# Patient Record
Sex: Male | Born: 2000
Health system: Southern US, Community
[De-identification: ages and names within clinical notes are randomized; demographics above are authoritative.]

## PROBLEM LIST (undated history)

## (undated) DIAGNOSIS — F909 Attention-deficit hyperactivity disorder, unspecified type: Secondary | ICD-10-CM

## (undated) DIAGNOSIS — R4589 Other symptoms and signs involving emotional state: Secondary | ICD-10-CM

## (undated) DIAGNOSIS — J309 Allergic rhinitis, unspecified: Secondary | ICD-10-CM

## (undated) DIAGNOSIS — F418 Other specified anxiety disorders: Secondary | ICD-10-CM

## (undated) DIAGNOSIS — N200 Calculus of kidney: Secondary | ICD-10-CM

## (undated) DIAGNOSIS — J45909 Unspecified asthma, uncomplicated: Principal | ICD-10-CM

## (undated) DIAGNOSIS — W3400XA Accidental discharge from unspecified firearms or gun, initial encounter: Secondary | ICD-10-CM

## (undated) HISTORY — DX: Allergic rhinitis, unspecified: J30.9

## (undated) HISTORY — DX: Unspecified asthma, uncomplicated: J45.909

## (undated) HISTORY — DX: Attention-deficit hyperactivity disorder, unspecified type: F90.9

## (undated) HISTORY — DX: Other symptoms and signs involving emotional state: R45.89

## (undated) HISTORY — PX: CIRCUMCISION: SUR203

## (undated) HISTORY — DX: Other specified anxiety disorders: F41.8

## (undated) HISTORY — PX: CLOSED REDUCTION MANDIBULAR FRACTURE W/ ARCH BARS: SHX1360

---
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
## 2022-01-01 ENCOUNTER — Other Ambulatory Visit: Payer: Self-pay | Admitting: Family Medicine

## 2022-01-01 DIAGNOSIS — E559 Vitamin D deficiency, unspecified: Secondary | ICD-10-CM

## 2022-01-01 MED ORDER — VITAMIN D (ERGOCALCIFEROL) 1.25 MG (50000 UNIT) PO CAPS: 50000.0000 [IU] | ORAL_CAPSULE | ORAL | 1 refills | Status: DC

## 2022-01-01 NOTE — Progress Notes (Signed)
Please inform the patient his Vit D is low, and his cholesterol is elevated. I recommend low carbs and fat diet. I've sent  a prescription for Vit D supplement to his pharmacy.

## 2022-01-13 ENCOUNTER — Encounter (HOSPITAL_COMMUNITY): Payer: Self-pay

## 2022-01-13 ENCOUNTER — Emergency Department (HOSPITAL_COMMUNITY)
Admission: EM | Admit: 2022-01-13 | Discharge: 2022-01-13 | Disposition: A | Discharge status: Home / Self Care | Payer: Medicaid Other | Attending: Emergency Medicine | Admitting: Emergency Medicine

## 2022-01-13 ENCOUNTER — Other Ambulatory Visit: Payer: Self-pay

## 2022-01-13 DIAGNOSIS — S025XXA Fracture of tooth (traumatic), initial encounter for closed fracture: Secondary | ICD-10-CM

## 2022-01-13 DIAGNOSIS — X58XXXA Exposure to other specified factors, initial encounter: Secondary | ICD-10-CM | POA: Insufficient documentation

## 2022-01-13 DIAGNOSIS — K0889 Other specified disorders of teeth and supporting structures: Secondary | ICD-10-CM | POA: Diagnosis not present

## 2022-01-13 MED ORDER — HYDROCODONE-ACETAMINOPHEN 5-325 MG PO TABS: 1.0000 | ORAL_TABLET | Freq: Four times a day (QID) | ORAL | 0 refills | Status: AC | PRN
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
## 2022-01-13 NOTE — ED Triage Notes (Signed)
Left lower dental pain has been to dentist has antibiotics and ibuprofen 800 mg

## 2022-01-20 ENCOUNTER — Other Ambulatory Visit: Payer: Self-pay

## 2022-01-20 ENCOUNTER — Encounter (HOSPITAL_COMMUNITY): Payer: Self-pay

## 2022-01-20 ENCOUNTER — Emergency Department (HOSPITAL_COMMUNITY)
Admission: EM | Admit: 2022-01-20 | Discharge: 2022-01-20 | Disposition: A | Discharge status: Home / Self Care | Payer: Medicaid Other | Attending: Emergency Medicine | Admitting: Emergency Medicine

## 2022-01-20 DIAGNOSIS — K0889 Other specified disorders of teeth and supporting structures: Secondary | ICD-10-CM | POA: Diagnosis not present

## 2022-01-20 DIAGNOSIS — J45909 Unspecified asthma, uncomplicated: Secondary | ICD-10-CM | POA: Diagnosis not present

## 2022-01-20 MED ORDER — HYDROCODONE-ACETAMINOPHEN 5-325 MG PO TABS: 1.0000 | ORAL_TABLET | Freq: Four times a day (QID) | ORAL | 0 refills | Status: DC | PRN

## 2022-01-20 MED ORDER — HYDROCODONE-ACETAMINOPHEN 5-325 MG PO TABS
1.0000 | ORAL_TABLET | Freq: Once | ORAL | Status: AC
  Administered 2022-01-20: 1 via ORAL
  Filled 2022-01-20: qty 1

## 2022-01-20 NOTE — ED Provider Notes (Signed)
[facility] EMERGENCY DEPARTMENT Provider Note   CSN: [id number] Arrival date & time: 01/20/22  1817     History  Chief Complaint  Patient presents with   Dental Pain    Carlos Horton is a 21 y.o. male who presents to the emergency department complaining of dental pain.  Patient has a known broken left lower molar.  He originally went to the dentist and early August and was placed on amoxicillin, then went to the ER on 8/8.  Recommended that he continue the amoxicillin and over-the-counter pain medicine.  He followed up with his doctor on 8/11.  He then presented to the ER on 8/26 for similar symptoms.  At that time he was going to be following up with a dentist.  Dental paste was placed over the tooth and he was discharged with pain medicine.  He went to the dentist and reported that they told him he needs to have a root canal and place a crown on the tooth.  He states that at that visit they evaluated him and then scheduled his root canal for October.  He presents today with persistent pain, requesting assistance with pain medicine before he can follow-up with his doctor at the beginning of next week.  He finished the amoxicillin about 3 days ago.  He denies any difficulty swallowing, difficulty breathing, fevers or chills.   Dental Pain Associated symptoms: no fever        Home Medications Prior to Admission medications   Medication Sig Start Date End Date Taking? Authorizing Provider  HYDROcodone-acetaminophen (NORCO/VICODIN) 5-325 MG tablet Take 1 tablet by mouth every 6 (six) hours as needed for severe pain. 01/20/22  Yes Sharaya Boruff T, PA-C  albuterol (VENTOLIN HFA) 108 (90 Base) MCG/ACT inhaler Inhale 2 puffs into the lungs every 4 (four) hours as needed for wheezing. 02/08/20   Bobbie Stack, MD  amoxicillin (AMOXIL) 500 MG capsule Take 500 mg by mouth 3 (three) times daily. 12/20/21   [provider]  busPIRone (BUSPAR) 5 MG tablet Take 1 tablet (5 mg total) by mouth 2  (two) times daily. 12/29/21   Gilmore Laroche, FNP  DULoxetine (CYMBALTA) 30 MG capsule Take 1 capsule (30 mg total) by mouth daily. 07/08/21   Massengill, Harrold Donath, MD  gabapentin (NEURONTIN) 100 MG capsule Take 1 capsule (100 mg total) by mouth 3 (three) times daily. 07/07/21   Massengill, Harrold Donath, MD  IBU 600 MG tablet Take 600 mg by mouth every 6 (six) hours as needed. 12/20/21   [provider]  traZODone (DESYREL) 50 MG tablet Take 1 tablet (50 mg total) by mouth at bedtime as needed for sleep. 07/07/21   Massengill, Harrold Donath, MD  Vitamin D, Ergocalciferol, (DRISDOL) 1.25 MG (50000 UNIT) CAPS capsule Take 1 capsule (50,000 Units total) by mouth every 7 (seven) days. 01/01/22   Gilmore Laroche, FNP      Allergies    Patient has no known allergies.    Review of Systems   Review of Systems  Constitutional:  Negative for chills and fever.  HENT:  Positive for dental problem. Negative for sore throat, trouble swallowing and voice change.   All other systems reviewed and are negative.   Physical Exam Updated Vital Signs BP (!) 151/70 (BP Location: Right Arm)   Pulse 96   Temp (!) 97.5 F (36.4 C) (Oral)   Resp 17   Ht 5\' 5"  (1.651 m)   Wt 83 kg   SpO2 98%   BMI 30.45  kg/m  Physical Exam Vitals and nursing note reviewed.  Constitutional:      Appearance: Normal appearance.  HENT:     Head: Normocephalic and atraumatic.     Mouth/Throat:     Pharynx: Oropharynx is clear. Uvula midline.     Tonsils: No tonsillar abscesses.     Comments: Fracture of the left lower molar Eyes:     Conjunctiva/sclera: Conjunctivae normal.  Pulmonary:     Effort: Pulmonary effort is normal. No respiratory distress.  Skin:    General: Skin is warm and dry.  Neurological:     Mental Status: He is alert.  Psychiatric:        Mood and Affect: Mood normal.        Behavior: Behavior normal.     ED Results / Procedures / Treatments   Labs (all labs ordered are listed, but only abnormal results  are displayed) Labs Reviewed - No data to display  EKG None  Radiology No results found.  Procedures Procedures    Medications Ordered in ED Medications  HYDROcodone-acetaminophen (NORCO/VICODIN) 5-325 MG per tablet 1 tablet (has no administration in time range)    ED Course/ Medical Decision Making/ A&P                           Medical Decision Making Risk Prescription drug management.   Patient is a 21 year old male with history of ADHD, allergic rhinitis, and asthma who presents to the emergency department complaining of dental pain.  Has a follow-up appointment with the dentist in October for root canal and crown placement of a broken tooth.  He presents today requesting sealant/paste to assist with food from getting in the crack of his tooth, as well as some pain medicine.  Chart reviewed.  Patient seen on 8/8 and 8/26 for similar dental pain.  Most recent visit patient had dental paste placed over the tooth and was discharged with pain medicine.  Advised to continue course of amoxicillin. PDMP reviewed and consistent with patient report.   On exam patient has fracture of the left lower molar consistent with what was seen at most recent ER visit.  No pharyngeal erythema or evidence of abscess.  Dental cement placed and patient tolerated procedure well. Given one dose of pain medicine in ER and short prescription sent to pharmacy. Patient plans to follow up with his primary doctor early next week. Patient discharged in stable condition and all questions answered.         Final Clinical Impression(s) / ED Diagnoses Final diagnoses:  Pain, dental    Rx / DC Orders ED Discharge Orders          Ordered    HYDROcodone-acetaminophen (NORCO/VICODIN) 5-325 MG tablet  Every 6 hours PRN        01/20/22 2009           Portions of this report may have been transcribed using voice recognition software. Every effort was made to ensure accuracy; however, inadvertent  computerized transcription errors may be present.    Jeanella Flattery 01/20/22 2109    Eber Hong, MD 01/21/22 [phone]

## 2022-01-20 NOTE — Discharge Instructions (Addendum)
You were seen in the emergency department for dental pain.  We have placed some additional paste and have given you some more pain medication. I recommend following up with your doctor next week to discuss if further antibiotics are needed.

## 2022-01-20 NOTE — ED Triage Notes (Signed)
Pt to er, pt c/o dental pain, pt states that he has a dentist and they were going to put a crown on his tooth, states that his appointment got delayed and he is here because his tooth hurts, states that last time he was here for the same they put a sealant on his tooth that helped

## 2022-01-23 ENCOUNTER — Telehealth: Payer: Self-pay | Admitting: Licensed Clinical Social Worker

## 2022-01-23 NOTE — Patient Outreach (Addendum)
  Care Coordination [facility] Note Transition Care Management Unsuccessful Follow-up Telephone Call  Date of discharge and from where:  01/20/22 from [facility]  Attempts:  1st Attempt  Reason for unsuccessful TCM follow-up call:  Left voice message  Dickie La, BSW, MSW, [employer] Managed Medicaid LCSW [facility]  [facility] [email or web address] Phone: [phone]

## 2022-01-26 ENCOUNTER — Telehealth: Payer: Self-pay | Admitting: *Deleted

## 2022-01-26 NOTE — Patient Outreach (Signed)
Care Coordination  01/26/2022  Carlos Horton Feb 23, 2001 [id number]   Transition Care Management Unsuccessful Follow-up Telephone Call  Date of discharge and from where:  01/20/22 from [facility]  Attempts:  2nd Attempt  Reason for unsuccessful TCM follow-up call:  No answer/busy   [name] RN, BSN [place]  [facility] RN Care Coordinator

## 2022-01-29 ENCOUNTER — Telehealth: Payer: Self-pay | Admitting: *Deleted

## 2022-01-29 ENCOUNTER — Emergency Department (HOSPITAL_COMMUNITY)
Admission: EM | Admit: 2022-01-29 | Discharge: 2022-01-30 | Disposition: A | Discharge status: Home / Self Care | Payer: Medicaid Other | Attending: Emergency Medicine | Admitting: Emergency Medicine

## 2022-01-29 ENCOUNTER — Other Ambulatory Visit: Payer: Self-pay

## 2022-01-29 ENCOUNTER — Ambulatory Visit: Payer: Medicaid Other | Admitting: Family Medicine

## 2022-01-29 ENCOUNTER — Encounter: Payer: Self-pay | Admitting: Family Medicine

## 2022-01-29 ENCOUNTER — Encounter (HOSPITAL_COMMUNITY): Payer: Self-pay

## 2022-01-29 DIAGNOSIS — K0889 Other specified disorders of teeth and supporting structures: Secondary | ICD-10-CM

## 2022-01-29 DIAGNOSIS — J45909 Unspecified asthma, uncomplicated: Secondary | ICD-10-CM | POA: Insufficient documentation

## 2022-01-29 DIAGNOSIS — K029 Dental caries, unspecified: Secondary | ICD-10-CM | POA: Insufficient documentation

## 2022-01-29 DIAGNOSIS — M269 Dentofacial anomaly, unspecified: Secondary | ICD-10-CM | POA: Diagnosis not present

## 2022-01-29 MED ORDER — KETOROLAC TROMETHAMINE 30 MG/ML IJ SOLN
30.0000 mg | Freq: Once | INTRAMUSCULAR | Status: AC
Start: 2022-01-29 — End: 2022-01-29
  Administered 2022-01-29: 30 mg via INTRAMUSCULAR
  Filled 2022-01-29: qty 1

## 2022-01-29 NOTE — Patient Outreach (Signed)
Care Coordination  01/29/2022  Carlos Horton 2000/08/05 [id number]   Transition Care Management Unsuccessful Follow-up Telephone Call  Date of discharge and from where:  01/20/22 from [facility]  Attempts:  3rd Attempt  Reason for unsuccessful TCM follow-up call:  No answer/busy  [name] RN, BSN [name]  [facility] RN Care Coordinator

## 2022-01-29 NOTE — ED Triage Notes (Signed)
Pt c/o dental pain, was seen here on 9/2 for the same. States his left back tooth has cracked more and the pain has gotten worse. Was unable to make his appointment with dentist today because he didn't have a ride, and they are unable to get him an another appointment until October.

## 2022-01-30 MED ORDER — NAPROXEN 500 MG PO TABS: 500.0000 mg | ORAL_TABLET | Freq: Two times a day (BID) | ORAL | 0 refills | Status: DC

## 2022-01-30 MED ORDER — AMOXICILLIN 500 MG PO CAPS: 500.0000 mg | ORAL_CAPSULE | Freq: Three times a day (TID) | ORAL | 0 refills | Status: DC

## 2022-01-30 NOTE — Discharge Instructions (Signed)
It is very important that you follow-up with the dentist.  In the meantime, make sure to take medications as directed.
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
## 2022-01-31 ENCOUNTER — Telehealth: Payer: Self-pay | Admitting: *Deleted

## 2022-01-31 NOTE — Patient Outreach (Signed)
Care Coordination  01/31/2022  EARON RIVEST 05-29-2000 [id number]   Transition Care Management Unsuccessful Follow-up Telephone Call  Date of discharge and from where:  01/30/22 from [facility]  Attempts:  1st Attempt  Reason for unsuccessful TCM follow-up call:  Missing or invalid number   [name] RN, BSN [place]  [facility] Care Coordinator

## 2022-02-01 ENCOUNTER — Telehealth: Payer: Self-pay | Admitting: *Deleted

## 2022-02-01 NOTE — Patient Outreach (Signed)
Care Coordination  02/01/2022  Carlos Horton 10/07/2000 [id number]   Transition Care Management Unsuccessful Follow-up Telephone Call  Date of discharge and from where:  01/30/22 from [facility]  Attempts:  2nd Attempt  Reason for unsuccessful TCM follow-up call:  No answer/busy  [name] RN, BSN [place]  [facility] RN Care Coordinator

## 2022-02-06 ENCOUNTER — Telehealth: Payer: Self-pay | Admitting: *Deleted

## 2022-02-06 NOTE — Patient Outreach (Signed)
Care Coordination  02/06/2022  NEIKO TRIVEDI [birth year] [id number]  Transition Care Management Unsuccessful Follow-up Telephone Call  Date of discharge and from where:  01/30/22 from [facility]  Attempts:  3rd Attempt  Reason for unsuccessful TCM follow-up call:  Unable to reach patient  Lurena Joiner RN, [place] RN Care Coordinator

## 2022-02-09 ENCOUNTER — Encounter (HOSPITAL_COMMUNITY): Payer: Self-pay | Admitting: Emergency Medicine

## 2022-02-09 ENCOUNTER — Emergency Department (HOSPITAL_COMMUNITY)
Admission: EM | Admit: 2022-02-09 | Discharge: 2022-02-09 | Disposition: A | Discharge status: Home / Self Care | Payer: Medicaid Other | Attending: Emergency Medicine | Admitting: Emergency Medicine

## 2022-02-09 DIAGNOSIS — R6884 Jaw pain: Secondary | ICD-10-CM | POA: Diagnosis present

## 2022-02-09 DIAGNOSIS — M26622 Arthralgia of left temporomandibular joint: Secondary | ICD-10-CM | POA: Insufficient documentation

## 2022-02-09 DIAGNOSIS — M26602 Left temporomandibular joint disorder, unspecified: Secondary | ICD-10-CM | POA: Diagnosis not present

## 2022-02-09 DIAGNOSIS — K0889 Other specified disorders of teeth and supporting structures: Secondary | ICD-10-CM | POA: Insufficient documentation

## 2022-02-09 MED ORDER — KETOROLAC TROMETHAMINE 30 MG/ML IJ SOLN
30.0000 mg | Freq: Once | INTRAMUSCULAR | Status: AC
  Administered 2022-02-09: 30 mg via INTRAMUSCULAR
  Filled 2022-02-09: qty 1

## 2022-02-09 NOTE — ED Provider Notes (Signed)
[facility] EMERGENCY DEPARTMENT  Provider Note  CSN: [id number] Arrival date & time: 02/09/22 0230  History Chief Complaint  Patient presents with   Jaw Pain    Carlos Horton is a 21 y.o. male with multiple recent ED visits for toothache reports L jaw popping and pain today. No dental pain. No fever.    Home Medications Prior to Admission medications   Medication Sig Start Date End Date Taking? Authorizing Provider  albuterol (VENTOLIN HFA) 108 (90 Base) MCG/ACT inhaler Inhale 2 puffs into the lungs every 4 (four) hours as needed for wheezing. 02/08/20   Wayna Chalet, MD  amoxicillin (AMOXIL) 500 MG capsule Take 1 capsule (500 mg total) by mouth 3 (three) times daily. 01/29/22   Horton, Barbette Hair, MD  busPIRone (BUSPAR) 5 MG tablet Take 1 tablet (5 mg total) by mouth 2 (two) times daily. 12/29/21   Alvira Monday, FNP  DULoxetine (CYMBALTA) 30 MG capsule Take 1 capsule (30 mg total) by mouth daily. 07/08/21   Massengill, Ovid Curd, MD  gabapentin (NEURONTIN) 100 MG capsule Take 1 capsule (100 mg total) by mouth 3 (three) times daily. 07/07/21   Massengill, Ovid Curd, MD  HYDROcodone-acetaminophen (NORCO/VICODIN) 5-325 MG tablet Take 1 tablet by mouth every 6 (six) hours as needed for severe pain. 01/20/22   Roemhildt, Lorin T, PA-C  IBU 600 MG tablet Take 600 mg by mouth every 6 (six) hours as needed. 12/20/21   [provider]  naproxen (NAPROSYN) 500 MG tablet Take 1 tablet (500 mg total) by mouth 2 (two) times daily. 01/30/22   Horton, Barbette Hair, MD  traZODone (DESYREL) 50 MG tablet Take 1 tablet (50 mg total) by mouth at bedtime as needed for sleep. 07/07/21   Massengill, Ovid Curd, MD  Vitamin D, Ergocalciferol, (DRISDOL) 1.25 MG (50000 UNIT) CAPS capsule Take 1 capsule (50,000 Units total) by mouth every 7 (seven) days. 01/01/22   Alvira Monday, [place]     Allergies    Patient has no known allergies.   Review of Systems   Review of Systems Please see HPI for pertinent positives and  negatives  Physical Exam BP (!) 148/95 (BP Location: Right Arm)   Pulse (!) 114   Temp (!) 97.5 F (36.4 C) (Oral)   Resp 16   Ht 5\' 5"  (1.651 m)   Wt 83 kg   SpO2 95%   BMI 30.45 kg/m   Physical Exam Vitals and nursing note reviewed.  HENT:     Head: Normocephalic.     Nose: Nose normal.     Mouth/Throat:     Comments: Poor dentition, but no tenderness to palpation of teeth, there is popping in L jaw with opening and closing mouth Eyes:     Extraocular Movements: Extraocular movements intact.  Pulmonary:     Effort: Pulmonary effort is normal.  Musculoskeletal:        General: Normal range of motion.     Cervical back: Neck supple.  Skin:    Findings: No rash (on exposed skin).  Neurological:     Mental Status: He is alert and oriented to person, place, and time.  Psychiatric:        Mood and Affect: Mood normal.     ED Results / Procedures / Treatments   EKG None  Procedures Procedures  Medications Ordered in the ED Medications  ketorolac (TORADOL) 30 MG/ML injection 30 mg (has no administration in time range)    Initial Impression and Plan  Patient here  with TMJ pain, no signs of dental infection. Recommend OTC NSAIDs, follow up with dentist as scheduled.   ED Course       MDM Rules/Calculators/A&P Medical Decision Making Problems Addressed: Arthralgia of left temporomandibular joint: acute illness or injury  Risk Prescription drug management.    Final Clinical Impression(s) / ED Diagnoses Final diagnoses:  Arthralgia of left temporomandibular joint    Rx / DC Orders ED Discharge Orders     None        Truddie Hidden, MD 02/09/22 [phone]

## 2022-02-09 NOTE — ED Triage Notes (Signed)
Pt c/o left sided jaw pain since earlier tonight. Pt states he was drinking some soda and his jaw popped and now it wont stop hurting.

## 2022-02-12 ENCOUNTER — Telehealth: Payer: Self-pay

## 2022-02-12 NOTE — Patient Outreach (Signed)
Care Coordination  02/12/2022  TALIK CASIQUE 20-Feb-2001 [id number]   Transition Care Management Unsuccessful Follow-up Telephone Call  Date of discharge and from where:  02/09/22 [facility]   Attempts:  1st Attempt  Reason for unsuccessful TCM follow-up call:  Left voice message   [name], [place], [place] Medicaid Team  [phone]

## 2022-02-13 ENCOUNTER — Telehealth: Payer: Self-pay

## 2022-02-13 NOTE — Patient Outreach (Signed)
Care Coordination  02/13/2022  Carlos Horton 02/12/2001 [id number]   Transition Care Management Unsuccessful Follow-up Telephone Call  Date of discharge and from where:  02/09/22 [facility]   Attempts:  2nd Attempt  Reason for unsuccessful TCM follow-up call:  Left voice message   [name], [place], [place] Medicaid Team  [phone]

## 2022-03-20 DIAGNOSIS — J069 Acute upper respiratory infection, unspecified: Secondary | ICD-10-CM | POA: Diagnosis not present

## 2022-03-20 DIAGNOSIS — J019 Acute sinusitis, unspecified: Secondary | ICD-10-CM | POA: Diagnosis not present

## 2022-03-20 DIAGNOSIS — K529 Noninfective gastroenteritis and colitis, unspecified: Secondary | ICD-10-CM | POA: Diagnosis not present

## 2022-03-20 DIAGNOSIS — J029 Acute pharyngitis, unspecified: Secondary | ICD-10-CM | POA: Diagnosis not present

## 2022-03-30 ENCOUNTER — Encounter: Payer: Self-pay | Admitting: Family Medicine

## 2022-03-30 ENCOUNTER — Ambulatory Visit (INDEPENDENT_AMBULATORY_CARE_PROVIDER_SITE_OTHER): Payer: Medicaid Other | Admitting: Family Medicine

## 2022-03-30 VITALS — BP 127/85 | HR 83 | Ht 66.0 in | Wt 182.0 lb

## 2022-03-30 DIAGNOSIS — F419 Anxiety disorder, unspecified: Secondary | ICD-10-CM

## 2022-03-30 DIAGNOSIS — K0889 Other specified disorders of teeth and supporting structures: Secondary | ICD-10-CM | POA: Diagnosis not present

## 2022-03-30 DIAGNOSIS — R7301 Impaired fasting glucose: Secondary | ICD-10-CM | POA: Diagnosis not present

## 2022-03-30 DIAGNOSIS — E7849 Other hyperlipidemia: Secondary | ICD-10-CM

## 2022-03-30 DIAGNOSIS — E559 Vitamin D deficiency, unspecified: Secondary | ICD-10-CM | POA: Diagnosis not present

## 2022-03-30 DIAGNOSIS — F332 Major depressive disorder, recurrent severe without psychotic features: Secondary | ICD-10-CM

## 2022-03-30 DIAGNOSIS — E038 Other specified hypothyroidism: Secondary | ICD-10-CM

## 2022-03-30 MED ORDER — ACETAMINOPHEN-CODEINE 300-30 MG PO TABS: 1.0000 | ORAL_TABLET | ORAL | 0 refills | Status: AC | PRN

## 2022-03-30 MED ORDER — VITAMIN D (ERGOCALCIFEROL) 1.25 MG (50000 UNIT) PO CAPS: 50000.0000 [IU] | ORAL_CAPSULE | ORAL | 3 refills | Status: AC

## 2022-03-30 MED ORDER — DULOXETINE HCL 30 MG PO CPEP: 30.0000 mg | ORAL_CAPSULE | Freq: Every day | ORAL | 1 refills | Status: AC

## 2022-03-30 MED ORDER — BUSPIRONE HCL 5 MG PO TABS: 5.0000 mg | ORAL_TABLET | Freq: Two times a day (BID) | ORAL | 2 refills | Status: AC

## 2022-03-30 NOTE — Assessment & Plan Note (Signed)
He denies suicidal ideation or homicidal ideation We will refill duloxetine 30 mg capsule daily Encourage the patient to start therapy and pick up medication at the pharmacy
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
## 2022-03-30 NOTE — Assessment & Plan Note (Signed)
Will refill BuSpar 5 mg to take twice daily He denies suicidal ideation or homicidal ideation

## 2022-03-30 NOTE — Patient Instructions (Signed)
I appreciate the opportunity to provide care to you today!    Follow up:  3 months  Labs: please stop by the lab during the week to get your blood drawn (CBC, CMP, TSH, Lipid profile, HgA1c, Vit D)     Please continue to a heart-healthy diet and increase your physical activities. Try to exercise for 30mins at least three times a week.      It was a pleasure to see you and I look forward to continuing to work together on your health and well-being. Please do not hesitate to call the office if you need care or have questions about your care.   Have a wonderful day and week. With Gratitude, Makailee Nudelman MSN, FNP-BC  

## 2022-03-30 NOTE — Assessment & Plan Note (Signed)
Will provide a short course of Acetaminophen-codeine 300-30 mg tablet for dental pain Encouraged to take medication only as needed

## 2022-04-09 ENCOUNTER — Telehealth: Payer: Self-pay

## 2022-04-09 NOTE — Telephone Encounter (Signed)
DOCUMENTATION

## 2022-05-22 ENCOUNTER — Other Ambulatory Visit: Payer: Self-pay

## 2022-05-22 ENCOUNTER — Encounter (HOSPITAL_COMMUNITY): Payer: Self-pay | Admitting: *Deleted

## 2022-05-22 ENCOUNTER — Emergency Department (HOSPITAL_COMMUNITY)
Admission: EM | Admit: 2022-05-22 | Discharge: 2022-05-22 | Disposition: A | Discharge status: Home / Self Care | Payer: Medicaid Other | Attending: Emergency Medicine | Admitting: Emergency Medicine

## 2022-05-22 DIAGNOSIS — K0889 Other specified disorders of teeth and supporting structures: Secondary | ICD-10-CM | POA: Diagnosis not present

## 2022-05-22 DIAGNOSIS — R519 Headache, unspecified: Secondary | ICD-10-CM | POA: Insufficient documentation

## 2022-05-22 DIAGNOSIS — K029 Dental caries, unspecified: Secondary | ICD-10-CM | POA: Diagnosis not present

## 2022-05-22 MED ORDER — HYDROCODONE-ACETAMINOPHEN 5-325 MG PO TABS: ORAL_TABLET | ORAL | 0 refills | Status: DC

## 2022-05-22 MED ORDER — PENICILLIN V POTASSIUM 500 MG PO TABS: 500.0000 mg | ORAL_TABLET | Freq: Three times a day (TID) | ORAL | 0 refills | Status: DC

## 2022-05-22 MED ORDER — IBUPROFEN 800 MG PO TABS: 800.0000 mg | ORAL_TABLET | Freq: Three times a day (TID) | ORAL | 0 refills | Status: DC

## 2022-05-22 NOTE — ED Triage Notes (Signed)
Pt c/o left lower dental pain that has been going on for "awhile", worsening yesterday. Pt reports he is supposed to have 2 of them pulled on 05/28/22. Pt reports not being able to eat anything yesterday due to the pain.

## 2022-05-22 NOTE — Discharge Instructions (Signed)
Patient to keep your upcoming dental appointment.  Take the antibiotics until your appointment time.  Warm salt water rinses may help as well.

## 2022-05-22 NOTE — ED Provider Notes (Signed)
[facility] EMERGENCY DEPARTMENT Provider Note   CSN: [id number] Arrival date & time: 05/22/22  2202     History  Chief Complaint  Patient presents with   Dental Pain    Carlos Horton is a 22 y.o. male.   Dental Pain Associated symptoms: no congestion, no facial swelling, no fever and no headaches         Carlos Horton is a 22 y.o. male who presents to the Emergency Department complaining of left lower dental pain and facial pain.  Symptoms present for 2 months.  Gradually worsening.  He has significant pain when attempting to eat hot or cold foods or liquids.  He was recently on antibiotics, penicillin, took last dose 2 weeks ago.  He is scheduled to have dental extraction on 05/28/2022.  He denies any fever, chills, facial swelling, neck pain, difficulty swallowing or breathing.   Home Medications Prior to Admission medications   Medication Sig Start Date End Date Taking? Authorizing Provider  albuterol (VENTOLIN HFA) 108 (90 Base) MCG/ACT inhaler Inhale 2 puffs into the lungs every 4 (four) hours as needed for wheezing. 02/08/20   Wayna Chalet, MD  busPIRone (BUSPAR) 5 MG tablet Take 1 tablet (5 mg total) by mouth 2 (two) times daily. 03/30/22   Alvira Monday, FNP  clindamycin (CLEOCIN) 300 MG capsule Take 300 mg by mouth every 6 (six) hours as needed. 03/08/22   [provider]  DULoxetine (CYMBALTA) 30 MG capsule Take 1 capsule (30 mg total) by mouth daily. 03/30/22   Alvira Monday, FNP  gabapentin (NEURONTIN) 100 MG capsule Take 1 capsule (100 mg total) by mouth 3 (three) times daily. 07/07/21   Massengill, Ovid Curd, MD  IBU 600 MG tablet Take 600 mg by mouth every 6 (six) hours as needed. 12/20/21   [provider]  naproxen (NAPROSYN) 500 MG tablet Take 1 tablet (500 mg total) by mouth 2 (two) times daily. 01/30/22   Horton, Barbette Hair, MD  traZODone (DESYREL) 50 MG tablet Take 1 tablet (50 mg total) by mouth at bedtime as needed for sleep. 07/07/21    Massengill, Ovid Curd, MD  Vitamin D, Ergocalciferol, (DRISDOL) 1.25 MG (50000 UNIT) CAPS capsule Take 1 capsule (50,000 Units total) by mouth every 7 (seven) days. 03/30/22   Alvira Monday, [place]      Allergies    Patient has no known allergies.    Review of Systems   Review of Systems  Constitutional:  Negative for appetite change, chills and fever.  HENT:  Positive for dental problem. Negative for congestion, ear pain, facial swelling, sore throat and trouble swallowing.   Respiratory:  Negative for cough and shortness of breath.   Cardiovascular:  Negative for chest pain.  Gastrointestinal:  Negative for abdominal pain, diarrhea and vomiting.  Neurological:  Negative for weakness, numbness and headaches.    Physical Exam Updated Vital Signs BP 119/74 (BP Location: Right Arm)   Pulse 75   Temp 98.1 F (36.7 C) (Oral)   Resp 16   Ht 5\' 6"  (1.676 m)   Wt 77.1 kg   SpO2 99%   BMI 27.44 kg/m  Physical Exam Vitals and nursing note reviewed.  Constitutional:      General: He is not in acute distress.    Appearance: Normal appearance. He is not ill-appearing or toxic-appearing.  HENT:     Right Ear: Tympanic membrane and ear canal normal.     Left Ear: Tympanic membrane and ear canal normal.  Mouth/Throat:     Mouth: Mucous membranes are moist.     Dentition: Dental tenderness and dental caries present. No gingival swelling or dental abscesses.     Pharynx: Oropharynx is clear. Uvula midline. No pharyngeal swelling, posterior oropharyngeal erythema or uvula swelling.     Comments: tenderness palpation of the left lower first molar, the tooth is partially avulsed.  Mild erythema of the surrounding gingiva.  No fluctuance, facial swelling.  Uvula midline nonedematous.  No swelling or abnormalities to the floor of the mouth. No trismus Cardiovascular:     Rate and Rhythm: Normal rate and regular rhythm.     Pulses: Normal pulses.  Pulmonary:     Effort: Pulmonary effort is  normal.  Abdominal:     Palpations: Abdomen is soft.     Tenderness: There is no abdominal tenderness.  Musculoskeletal:        General: Normal range of motion.     Cervical back: Normal range of motion. No tenderness.  Lymphadenopathy:     Cervical: No cervical adenopathy.  Skin:    General: Skin is warm.     Capillary Refill: Capillary refill takes less than 2 seconds.  Neurological:     General: No focal deficit present.     Mental Status: He is alert.     Sensory: No sensory deficit.     Motor: No weakness.     ED Results / Procedures / Treatments   Labs (all labs ordered are listed, but only abnormal results are displayed) Labs Reviewed - No data to display  EKG None  Radiology No results found.  Procedures Procedures    Medications Ordered in ED Medications - No data to display  ED Course/ Medical Decision Making/ A&P                           Medical Decision Making Patient here for dental pain x 2 months.  Gradually worsening.  Recently pain became worse and now unable to eat or drink hot or cold liquids.  Some facial pain as well without edema, no fever or chills.  No swelling or abnormality to the floor of the mouth.  Has appointment with his dentist on 05/28/2022 for extraction.  Completed course of antibiotics 2 weeks ago.  On my exam, patient well-appearing nontoxic.  I do not appreciate any facial edema or trismus.  He does have some tenderness to the left lower first molar, the tooth is partially avulsed.  Do not appreciate any dental abscess at this time.  Differential would include dental pain, developing dental abscess, Ludewig's angina.  Clinically I suspect this is related to dental pain I do not appreciate any concerning symptoms for abscess at this time.  Amount and/or Complexity of Data Reviewed Discussion of management or test interpretation with external provider(s): Database reviewed, will start patient back on antibiotics with short course of  pain medication.  He is agreeable to plan and will keep his upcoming dental appointment.           Final Clinical Impression(s) / ED Diagnoses Final diagnoses:  Pain, dental    Rx / DC Orders ED Discharge Orders     None         Kem Parkinson, PA-C 05/22/22 0950    Fransico Meadow, MD 05/23/22 [id number]

## 2022-06-05 ENCOUNTER — Other Ambulatory Visit: Payer: Self-pay

## 2022-06-05 ENCOUNTER — Emergency Department (HOSPITAL_COMMUNITY)
Admission: EM | Admit: 2022-06-05 | Discharge: 2022-06-05 | Disposition: A | Discharge status: Home / Self Care | Payer: Medicaid Other | Attending: Emergency Medicine | Admitting: Emergency Medicine

## 2022-06-05 ENCOUNTER — Encounter (HOSPITAL_COMMUNITY): Payer: Self-pay

## 2022-06-05 DIAGNOSIS — K0889 Other specified disorders of teeth and supporting structures: Secondary | ICD-10-CM | POA: Diagnosis not present

## 2022-06-05 DIAGNOSIS — M273 Alveolitis of jaws: Secondary | ICD-10-CM | POA: Diagnosis not present

## 2022-06-05 MED ORDER — HYDROCODONE-ACETAMINOPHEN 5-325 MG PO TABS
1.0000 | ORAL_TABLET | Freq: Once | ORAL | Status: AC
  Administered 2022-06-05: 1 via ORAL
  Filled 2022-06-05: qty 1

## 2022-06-05 MED ORDER — HYDROCODONE-ACETAMINOPHEN 5-325 MG PO TABS: ORAL_TABLET | ORAL | 0 refills | Status: DC

## 2022-06-05 NOTE — ED Provider Notes (Signed)
[facility] EMERGENCY DEPARTMENT Provider Note   CSN: [id number] Arrival date & time: 06/05/22  1758     History  Chief Complaint  Patient presents with   Dental Pain    Carlos Horton is a 22 y.o. male.   Dental Pain Associated symptoms: no facial swelling, no fever and no headaches        Carlos Horton is a 22 y.o. male who presents to the Emergency Department complaining of postextraction dental pain.  States he had a left lower tooth removed yesterday.  Denies any complications with the extraction, but had some mild bleeding earlier and increased pain at the socket after using his vape pen.  Bleeding spontaneously resolved.  He denies any facial swelling fever or chills.  No neck pain.  No difficulty swallowing  Home Medications Prior to Admission medications   Medication Sig Start Date End Date Taking? Authorizing Provider  albuterol (VENTOLIN HFA) 108 (90 Base) MCG/ACT inhaler Inhale 2 puffs into the lungs every 4 (four) hours as needed for wheezing. 02/08/20   Wayna Chalet, MD  busPIRone (BUSPAR) 5 MG tablet Take 1 tablet (5 mg total) by mouth 2 (two) times daily. 03/30/22   Alvira Monday, FNP  clindamycin (CLEOCIN) 300 MG capsule Take 300 mg by mouth every 6 (six) hours as needed. 03/08/22   [provider]  DULoxetine (CYMBALTA) 30 MG capsule Take 1 capsule (30 mg total) by mouth daily. 03/30/22   Alvira Monday, FNP  gabapentin (NEURONTIN) 100 MG capsule Take 1 capsule (100 mg total) by mouth 3 (three) times daily. 07/07/21   Massengill, Ovid Curd, MD  HYDROcodone-acetaminophen (NORCO/VICODIN) 5-325 MG tablet Take one tab po q 4 hrs prn pain 05/22/22   Sincerity Cedar, PA-C  ibuprofen (ADVIL) 800 MG tablet Take 1 tablet (800 mg total) by mouth 3 (three) times daily. Take with food 05/22/22   Maloree Uplinger, PA-C  penicillin v potassium (VEETID) 500 MG tablet Take 1 tablet (500 mg total) by mouth 3 (three) times daily. 05/22/22   Glynn Yepes, PA-C  traZODone  (DESYREL) 50 MG tablet Take 1 tablet (50 mg total) by mouth at bedtime as needed for sleep. 07/07/21   Massengill, Ovid Curd, MD  Vitamin D, Ergocalciferol, (DRISDOL) 1.25 MG (50000 UNIT) CAPS capsule Take 1 capsule (50,000 Units total) by mouth every 7 (seven) days. 03/30/22   Alvira Monday, [place]      Allergies    Patient has no known allergies.    Review of Systems   Review of Systems  Constitutional:  Negative for chills and fever.  HENT:  Positive for dental problem. Negative for facial swelling, sore throat and trouble swallowing.   Respiratory:  Negative for cough.   Cardiovascular:  Negative for chest pain.  Gastrointestinal:  Negative for abdominal pain, nausea and vomiting.  Neurological:  Negative for headaches.    Physical Exam Updated Vital Signs BP (!) 138/96 (BP Location: Right Arm)   Pulse 80   Temp 98.4 F (36.9 C) (Oral)   Resp 18   Ht 5\' 5"  (1.651 m)   Wt 79.5 kg   SpO2 99%   BMI 29.17 kg/m  Physical Exam Vitals and nursing note reviewed.  Constitutional:      General: He is not in acute distress.    Appearance: Normal appearance. He is not ill-appearing or toxic-appearing.  HENT:     Mouth/Throat:     Mouth: Mucous membranes are moist.     Pharynx: Oropharynx is clear.  Comments: Tenderness of the gingiva at the area of the left first lower molar.  Post dental extraction.  Clot in place. No active bleeding of the socket.  No facial edema or trismus.  Uvula midline nonedematous. Cardiovascular:     Rate and Rhythm: Normal rate and regular rhythm.     Pulses: Normal pulses.  Pulmonary:     Effort: Pulmonary effort is normal.  Musculoskeletal:     Cervical back: Normal range of motion. No rigidity.  Lymphadenopathy:     Cervical: No cervical adenopathy.  Skin:    General: Skin is warm.  Neurological:     Mental Status: He is alert.     ED Results / Procedures / Treatments   Labs (all labs ordered are listed, but only abnormal results are  displayed) Labs Reviewed - No data to display  EKG None  Radiology No results found.  Procedures Procedures    Medications Ordered in ED Medications - No data to display  ED Course/ Medical Decision Making/ A&P                             Medical Decision Making Patient here for postextraction dental pain.  Concerned that he may have dry socket.  Had tooth extracted yesterday pain unrelieved with ibuprofen.  Pain worse today after attempting to use his vape pen.  On exam, patient has some tenderness of the left lower gingiva.  Socket is in place.  No active bleeding.  There is no trismus or facial swelling on exam.    Amount and/or Complexity of Data Reviewed Discussion of management or test interpretation with external provider(s): Patient appears appropriate for discharge home, doubt emergent process.  No active bleeding.  Short course of pain medication provided.  He has ibuprofen and will continue to take as directed.           Final Clinical Impression(s) / ED Diagnoses Final diagnoses:  Pain of tooth socket    Rx / DC Orders ED Discharge Orders     None         Bufford Lope 06/05/22 2100    Godfrey Pick, MD 06/06/22 0021

## 2022-06-05 NOTE — ED Triage Notes (Signed)
Pt presents to ED with complaints of left lower dental pain after tooth extraction yesterday. Pt states he has been taking advil without relief

## 2022-06-05 NOTE — Discharge Instructions (Signed)
Continue soft food diet and your ibuprofen as directed.  Follow-up with your dentist for recheck if needed.

## 2022-06-24 DIAGNOSIS — B349 Viral infection, unspecified: Secondary | ICD-10-CM | POA: Diagnosis not present

## 2022-06-24 DIAGNOSIS — R059 Cough, unspecified: Secondary | ICD-10-CM | POA: Diagnosis not present

## 2022-06-25 ENCOUNTER — Emergency Department (HOSPITAL_COMMUNITY): Payer: Medicaid Other

## 2022-06-25 ENCOUNTER — Emergency Department (HOSPITAL_COMMUNITY)
Admission: EM | Admit: 2022-06-25 | Discharge: 2022-06-26 | Disposition: A | Discharge status: Home / Self Care | Payer: Medicaid Other | Attending: Emergency Medicine | Admitting: Emergency Medicine

## 2022-06-25 ENCOUNTER — Encounter (HOSPITAL_COMMUNITY): Payer: Self-pay | Admitting: Emergency Medicine

## 2022-06-25 ENCOUNTER — Other Ambulatory Visit: Payer: Self-pay

## 2022-06-25 DIAGNOSIS — J45909 Unspecified asthma, uncomplicated: Secondary | ICD-10-CM | POA: Insufficient documentation

## 2022-06-25 DIAGNOSIS — J101 Influenza due to other identified influenza virus with other respiratory manifestations: Secondary | ICD-10-CM | POA: Insufficient documentation

## 2022-06-25 DIAGNOSIS — R000 Tachycardia, unspecified: Secondary | ICD-10-CM | POA: Insufficient documentation

## 2022-06-25 DIAGNOSIS — Z79899 Other long term (current) drug therapy: Secondary | ICD-10-CM | POA: Diagnosis not present

## 2022-06-25 DIAGNOSIS — Z1152 Encounter for screening for COVID-19: Secondary | ICD-10-CM | POA: Diagnosis not present

## 2022-06-25 DIAGNOSIS — Z7951 Long term (current) use of inhaled steroids: Secondary | ICD-10-CM | POA: Insufficient documentation

## 2022-06-25 DIAGNOSIS — R059 Cough, unspecified: Secondary | ICD-10-CM | POA: Diagnosis not present

## 2022-06-25 LAB — RESP PANEL BY RT-PCR (RSV, FLU A&B, COVID)  RVPGX2
Influenza A by PCR: NEGATIVE
Influenza B by PCR: POSITIVE — AB
Resp Syncytial Virus by PCR: NEGATIVE
SARS Coronavirus 2 by RT PCR: NEGATIVE

## 2022-06-25 MED ORDER — KETOROLAC TROMETHAMINE 30 MG/ML IJ SOLN
15.0000 mg | Freq: Once | INTRAMUSCULAR | Status: AC
  Administered 2022-06-25: 15 mg via INTRAVENOUS
  Filled 2022-06-25: qty 1

## 2022-06-25 MED ORDER — HYDROCODONE BIT-HOMATROP MBR 5-1.5 MG/5ML PO SOLN
5.0000 mL | Freq: Once | ORAL | Status: AC
  Administered 2022-06-25: 5 mL via ORAL
  Filled 2022-06-25: qty 5

## 2022-06-25 MED ORDER — ONDANSETRON HCL 4 MG/2ML IJ SOLN
4.0000 mg | Freq: Once | INTRAMUSCULAR | Status: AC
  Administered 2022-06-25: 4 mg via INTRAVENOUS
  Filled 2022-06-25: qty 2

## 2022-06-25 NOTE — ED Triage Notes (Signed)
Pt c/o cough, back pain, ear popping and states he feels like "death since 09-06-22".

## 2022-06-25 NOTE — ED Provider Notes (Signed)
[facility] Provider Note   CSN: [id number] Arrival date & time: 06/25/22  2226     History {Add pertinent medical, surgical, social history, OB history to HPI:1} Chief Complaint  Patient presents with   Cough    MIKYLE SOX is a 22 y.o. male.  22 year old male presents with his father secondary to cough, emesis, fever myalgias.  Patient states he was sick for 2 days.  Tested for COVID multiple times here in urgent care was negative.  Has a history of asthma and has been using his inhaler without relief.  Mother with [place].  Patient states decreased ability to eat and decreased sleep. Initially unable to obtain full history secondary to coughing.   Cough      Home Medications Prior to Admission medications   Medication Sig Start Date End Date Taking? Authorizing Provider  albuterol (VENTOLIN HFA) 108 (90 Base) MCG/ACT inhaler Inhale 2 puffs into the lungs every 4 (four) hours as needed for wheezing. 02/08/20   Wayna Chalet, MD  busPIRone (BUSPAR) 5 MG tablet Take 1 tablet (5 mg total) by mouth 2 (two) times daily. 03/30/22   Alvira Monday, FNP  clindamycin (CLEOCIN) 300 MG capsule Take 300 mg by mouth every 6 (six) hours as needed. 03/08/22   [provider]  DULoxetine (CYMBALTA) 30 MG capsule Take 1 capsule (30 mg total) by mouth daily. 03/30/22   Alvira Monday, FNP  gabapentin (NEURONTIN) 100 MG capsule Take 1 capsule (100 mg total) by mouth 3 (three) times daily. 07/07/21   Massengill, Ovid Curd, MD  HYDROcodone-acetaminophen (NORCO/VICODIN) 5-325 MG tablet Take one tab po q 4 hrs prn pain 06/05/22   Triplett, Tammy, PA-C  ibuprofen (ADVIL) 800 MG tablet Take 1 tablet (800 mg total) by mouth 3 (three) times daily. Take with food 05/22/22   Triplett, Tammy, PA-C  penicillin v potassium (VEETID) 500 MG tablet Take 1 tablet (500 mg total) by mouth 3 (three) times daily. 05/22/22   Triplett, Tammy, PA-C  traZODone (DESYREL) 50 MG  tablet Take 1 tablet (50 mg total) by mouth at bedtime as needed for sleep. 07/07/21   Massengill, Ovid Curd, MD  Vitamin D, Ergocalciferol, (DRISDOL) 1.25 MG (50000 UNIT) CAPS capsule Take 1 capsule (50,000 Units total) by mouth every 7 (seven) days. 03/30/22   Alvira Monday, [place]      Allergies    Patient has no known allergies.    Review of Systems   Review of Systems  Respiratory:  Positive for cough.     Physical Exam Updated Vital Signs BP 136/68 (BP Location: Right Arm)   Pulse (!) 109   Temp 99.5 F (37.5 C) (Oral)   Resp 18   Ht 5\' 5"  (1.651 m)   Wt 80 kg   SpO2 97%   BMI 29.35 kg/m  Physical Exam Vitals and nursing note reviewed.  Constitutional:      Appearance: He is well-developed.  HENT:     Head: Normocephalic and atraumatic.  Eyes:     Pupils: Pupils are equal, round, and reactive to light.  Cardiovascular:     Rate and Rhythm: Normal rate.  Pulmonary:     Effort: Pulmonary effort is normal. No respiratory distress.  Abdominal:     General: Abdomen is flat. There is no distension.  Musculoskeletal:        General: Normal range of motion.     Cervical back: Normal range of motion.  Skin:    General:  Skin is warm and dry.     Coloration: Skin is not jaundiced or pale.  Neurological:     General: No focal deficit present.     Mental Status: He is alert.     ED Results / Procedures / Treatments   Labs (all labs ordered are listed, but only abnormal results are displayed) Labs Reviewed  RESP PANEL BY RT-PCR (RSV, FLU A&B, COVID)  RVPGX2  CBC WITH DIFFERENTIAL/PLATELET  COMPREHENSIVE METABOLIC PANEL  LIPASE, BLOOD  RAPID URINE DRUG SCREEN, HOSP PERFORMED  URINALYSIS, ROUTINE W REFLEX MICROSCOPIC    EKG None  Radiology No results found.  Procedures Procedures  {Document cardiac monitor, telemetry assessment procedure when appropriate:1}  Medications Ordered in ED Medications  HYDROcodone bit-homatropine (HYCODAN) 5-1.5 MG/5ML syrup 5 mL  (has no administration in time range)  ketorolac (TORADOL) 30 MG/ML injection 15 mg (has no administration in time range)  ondansetron (ZOFRAN) injection 4 mg (has no administration in time range)    ED Course/ Medical Decision Making/ A&P   {   Click here for ABCD2, HEART and other calculatorsREFRESH Note before signing :1}                          Medical Decision Making Amount and/or Complexity of Data Reviewed Labs: ordered. Radiology: ordered.  Risk Prescription drug management.  Likely viral. Seems influenza like. Is tachycardic. Will treat symptomatically, check cxr to ensure no lobar pneumonia and labs to ensure no significant dehydration.  ***  {Document critical care time when appropriate:1} {Document review of labs and clinical decision tools ie heart score, Chads2Vasc2 etc:1}  {Document your independent review of radiology images, and any outside records:1} {Document your discussion with family members, caretakers, and with consultants:1} {Document social determinants of health affecting pt's care:1} {Document your decision making why or why not admission, treatments were needed:1} Final Clinical Impression(s) / ED Diagnoses Final diagnoses:  None    Rx / DC Orders ED Discharge Orders     None

## 2022-06-26 LAB — COMPREHENSIVE METABOLIC PANEL
ALT: 33 U/L (ref 0–44)
AST: 31 U/L (ref 15–41)
Albumin: 4.2 g/dL (ref 3.5–5.0)
Alkaline Phosphatase: 69 U/L (ref 38–126)
Anion gap: 15 (ref 5–15)
BUN: 10 mg/dL (ref 6–20)
CO2: 17 mmol/L — ABNORMAL LOW (ref 22–32)
Calcium: 8.8 mg/dL — ABNORMAL LOW (ref 8.9–10.3)
Chloride: 105 mmol/L (ref 98–111)
Creatinine, Ser: 0.93 mg/dL (ref 0.61–1.24)
GFR, Estimated: >60 mL/min (ref >60)
Glucose, Bld: 111 mg/dL — ABNORMAL HIGH (ref 70–99)
Potassium: 3.3 mmol/L — ABNORMAL LOW (ref 3.5–5.1)
Sodium: 137 mmol/L (ref 135–145)
Total Bilirubin: 1 mg/dL (ref 0.3–1.2)
Total Protein: 7.3 g/dL (ref 6.5–8.1)

## 2022-06-26 LAB — RAPID URINE DRUG SCREEN, HOSP PERFORMED
Amphetamines: NOT DETECTED
Barbiturates: NOT DETECTED
Benzodiazepines: NOT DETECTED
Cocaine: NOT DETECTED
Opiates: POSITIVE — AB
Tetrahydrocannabinol: POSITIVE — AB

## 2022-06-26 LAB — CBC WITH DIFFERENTIAL/PLATELET
Abs Immature Granulocytes: 0 10*3/uL (ref 0.00–0.07)
Basophils Absolute: 0 10*3/uL (ref 0.0–0.1)
Basophils Relative: 0 %
Eosinophils Absolute: 0.4 10*3/uL (ref 0.0–0.5)
Eosinophils Relative: 4 %
HCT: 44.5 % (ref 39.0–52.0)
Hemoglobin: 15.5 g/dL (ref 13.0–17.0)
Lymphocytes Relative: 10 %
Lymphs Abs: 1 10*3/uL (ref 0.7–4.0)
MCH: 30.7 pg (ref 26.0–34.0)
MCHC: 34.8 g/dL (ref 30.0–36.0)
MCV: 88.1 fL (ref 80.0–100.0)
Monocytes Absolute: 1 10*3/uL (ref 0.1–1.0)
Monocytes Relative: 10 %
Neutro Abs: 7.6 10*3/uL (ref 1.7–7.7)
Neutrophils Relative %: 76 %
Platelets: 269 10*3/uL (ref 150–400)
RBC: 5.05 MIL/uL (ref 4.22–5.81)
RDW: 12.3 % (ref 11.5–15.5)
WBC: 10 10*3/uL (ref 4.0–10.5)
nRBC: 0 % (ref 0.0–0.2)

## 2022-06-26 LAB — URINALYSIS, ROUTINE W REFLEX MICROSCOPIC
Bacteria, UA: NONE SEEN
Bilirubin Urine: NEGATIVE
Glucose, UA: NEGATIVE mg/dL
Ketones, ur: 80 mg/dL — AB
Leukocytes,Ua: NEGATIVE
Nitrite: NEGATIVE
Protein, ur: 100 mg/dL — AB
RBC / HPF: >50 RBC/hpf (ref 0–5)
Specific Gravity, Urine: 1.024 (ref 1.005–1.030)
pH: 6 (ref 5.0–8.0)

## 2022-06-26 LAB — LIPASE, BLOOD: Lipase: 29 U/L (ref 11–51)

## 2022-06-26 MED ORDER — METHYLPREDNISOLONE SODIUM SUCC 125 MG IJ SOLR
125.0000 mg | Freq: Once | INTRAMUSCULAR | Status: AC
  Administered 2022-06-26: 125 mg via INTRAVENOUS
  Filled 2022-06-26: qty 2

## 2022-06-26 MED ORDER — ONDANSETRON HCL 4 MG PO TABS: 4.0000 mg | ORAL_TABLET | Freq: Three times a day (TID) | ORAL | 0 refills | Status: DC | PRN

## 2022-06-26 MED ORDER — ACETAMINOPHEN 500 MG PO TABS
1000.0000 mg | ORAL_TABLET | Freq: Once | ORAL | Status: AC
  Administered 2022-06-26: 1000 mg via ORAL
  Filled 2022-06-26: qty 2

## 2022-06-26 MED ORDER — LACTATED RINGERS IV BOLUS
1000.0000 mL | Freq: Once | INTRAVENOUS | Status: AC
  Administered 2022-06-26: 1000 mL via INTRAVENOUS

## 2022-06-26 MED ORDER — HYDROCODONE BIT-HOMATROP MBR 5-1.5 MG/5ML PO SOLN: 5.0000 mL | Freq: Four times a day (QID) | ORAL | 0 refills | Status: DC | PRN

## 2022-06-26 MED ORDER — IPRATROPIUM-ALBUTEROL 0.5-2.5 (3) MG/3ML IN SOLN
3.0000 mL | RESPIRATORY_TRACT | Status: AC
  Administered 2022-06-26 (×3): 3 mL via RESPIRATORY_TRACT
  Filled 2022-06-26: qty 9

## 2022-07-02 ENCOUNTER — Ambulatory Visit: Payer: Medicaid Other | Admitting: Family Medicine

## 2022-07-05 ENCOUNTER — Encounter: Payer: Self-pay | Admitting: Family Medicine
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
## 2023-01-12 ENCOUNTER — Emergency Department (HOSPITAL_COMMUNITY)
Admission: EM | Admit: 2023-01-12 | Discharge: 2023-01-12 | Disposition: A | Discharge status: Home / Self Care | Payer: Medicaid Other | Attending: Emergency Medicine | Admitting: Emergency Medicine

## 2023-01-12 ENCOUNTER — Encounter (HOSPITAL_COMMUNITY): Payer: Self-pay | Admitting: Emergency Medicine

## 2023-01-12 ENCOUNTER — Other Ambulatory Visit: Payer: Self-pay

## 2023-01-12 DIAGNOSIS — K0889 Other specified disorders of teeth and supporting structures: Secondary | ICD-10-CM | POA: Insufficient documentation

## 2023-01-12 MED ORDER — PENICILLIN V POTASSIUM 500 MG PO TABS: 500.0000 mg | ORAL_TABLET | Freq: Three times a day (TID) | ORAL | 0 refills | Status: AC

## 2023-01-12 MED ORDER — PENICILLIN V POTASSIUM 250 MG PO TABS
500.0000 mg | ORAL_TABLET | Freq: Once | ORAL | Status: AC
  Administered 2023-01-12: 500 mg via ORAL
  Filled 2023-01-12: qty 2

## 2023-01-12 MED ORDER — KETOROLAC TROMETHAMINE 60 MG/2ML IM SOLN
30.0000 mg | Freq: Once | INTRAMUSCULAR | Status: AC
  Administered 2023-01-12: 30 mg via INTRAMUSCULAR
  Filled 2023-01-12: qty 2

## 2023-01-12 NOTE — ED Triage Notes (Signed)
Pt with c/o R upper dental pain. States he is scheduled to have tooth pulled on Monday. Pt also would like some resources for a PCP that accepts Medicaid.

## 2023-01-12 NOTE — ED Provider Notes (Signed)
[place] EMERGENCY DEPARTMENT AT [facility] Provider Note   CSN: [id number] Arrival date & time: 01/12/23  0031     History  Chief Complaint  Patient presents with   Dental Pain    Carlos Horton is a 22 y.o. male.  The history is provided by the patient.  Dental Pain Location:  Upper Quality:  Aching Severity:  Moderate Onset quality:  Gradual Timing:  Constant Progression:  Worsening Chronicity:  Recurrent Associated symptoms: no facial swelling   Pt reports teeth started hurting after eating dinner Reports he has dental extraction planned for next week He is not on Antibiotics     Home Medications Prior to Admission medications   Medication Sig Start Date End Date Taking? Authorizing Provider  penicillin v potassium (VEETID) 500 MG tablet Take 1 tablet (500 mg total) by mouth 3 (three) times daily for 7 days. 01/12/23 01/19/23 Yes Zadie Rhine, MD  albuterol (VENTOLIN HFA) 108 (90 Base) MCG/ACT inhaler Inhale 2 puffs into the lungs every 4 (four) hours as needed for wheezing. 02/08/20   Bobbie Stack, MD  busPIRone (BUSPAR) 5 MG tablet Take 1 tablet (5 mg total) by mouth 2 (two) times daily. 03/30/22   Gilmore Laroche, FNP  DULoxetine (CYMBALTA) 30 MG capsule Take 1 capsule (30 mg total) by mouth daily. 03/30/22   Gilmore Laroche, FNP  gabapentin (NEURONTIN) 100 MG capsule Take 1 capsule (100 mg total) by mouth 3 (three) times daily. 07/07/21   Massengill, Harrold Donath, MD  traZODone (DESYREL) 50 MG tablet Take 1 tablet (50 mg total) by mouth at bedtime as needed for sleep. 07/07/21   Massengill, Harrold Donath, MD  Vitamin D, Ergocalciferol, (DRISDOL) 1.25 MG (50000 UNIT) CAPS capsule Take 1 capsule (50,000 Units total) by mouth every 7 (seven) days. 03/30/22   Gilmore Laroche, FNP      Allergies    Patient has no known allergies.    Review of Systems   Review of Systems  HENT:  Negative for facial swelling.   Eyes:  Negative for visual disturbance.   Gastrointestinal:  Negative for vomiting.    Physical Exam Updated Vital Signs BP 130/72 (BP Location: Right Arm)   Pulse (!) 106   Temp 98.7 F (37.1 C) (Oral)   Resp 16   Ht 1.651 m (5\' 5" )   Wt 79.8 kg   SpO2 99%   BMI 29.29 kg/m  Physical Exam CONSTITUTIONAL: Well developed/well nourished HEAD AND FACE: Normocephalic/atraumatic EYES: EOMI/PERRL ENMT: Mucous membranes moist.  Poor dentition.  No trismus.  No focal abscess noted. NEURO: Pt is awake/alert, moves all extremitiesx4 EXTREMITIES:full ROM SKIN: warm, color normal  ED Results / Procedures / Treatments   Labs (all labs ordered are listed, but only abnormal results are displayed) Labs Reviewed - No data to display  EKG None  Radiology No results found.  Procedures Procedures    Medications Ordered in ED Medications  penicillin v potassium (VEETID) tablet 500 mg (has no administration in time range)  ketorolac (TORADOL) injection 30 mg (has no administration in time range)    ED Course/ Medical Decision Making/ A&P                                 Medical Decision Making Risk Prescription drug management.   Will restart PCN One time dose of toradol here F/u with dentistry        Final Clinical Impression(s) / ED  Diagnoses Final diagnoses:  Pain, dental    Rx / DC Orders ED Discharge Orders          Ordered    penicillin v potassium (VEETID) 500 MG tablet  3 times daily        01/12/23 0235              Zadie Rhine, MD 01/12/23 [id number]

## 2023-02-28 ENCOUNTER — Other Ambulatory Visit: Payer: Self-pay

## 2023-02-28 ENCOUNTER — Encounter (HOSPITAL_COMMUNITY): Payer: Self-pay

## 2023-02-28 ENCOUNTER — Emergency Department (HOSPITAL_COMMUNITY)
Admission: EM | Admit: 2023-02-28 | Discharge: 2023-02-28 | Disposition: A | Discharge status: Home / Self Care | Payer: Medicaid Other | Attending: Emergency Medicine | Admitting: Emergency Medicine

## 2023-02-28 ENCOUNTER — Emergency Department (HOSPITAL_COMMUNITY): Payer: Medicaid Other

## 2023-02-28 DIAGNOSIS — M545 Low back pain, unspecified: Secondary | ICD-10-CM | POA: Diagnosis present

## 2023-02-28 DIAGNOSIS — M5459 Other low back pain: Secondary | ICD-10-CM | POA: Diagnosis not present

## 2023-02-28 MED ORDER — NAPROXEN 500 MG PO TABS: 500.0000 mg | ORAL_TABLET | Freq: Two times a day (BID) | ORAL | 0 refills | Status: DC

## 2023-02-28 MED ORDER — KETOROLAC TROMETHAMINE 10 MG PO TABS
10.0000 mg | ORAL_TABLET | Freq: Once | ORAL | Status: AC
  Administered 2023-02-28: 10 mg via ORAL
  Filled 2023-02-28: qty 1

## 2023-02-28 MED ORDER — METHOCARBAMOL 500 MG PO TABS
500.0000 mg | ORAL_TABLET | Freq: Once | ORAL | Status: AC
  Administered 2023-02-28: 500 mg via ORAL
  Filled 2023-02-28: qty 1

## 2023-02-28 MED ORDER — METHOCARBAMOL 500 MG PO TABS: 500.0000 mg | ORAL_TABLET | Freq: Two times a day (BID) | ORAL | 0 refills | Status: AC

## 2023-02-28 NOTE — ED Triage Notes (Signed)
Pt reports he woke up this morning and his lower back was sore and when he got up it got worse.

## 2023-02-28 NOTE — ED Provider Notes (Signed)
[place] EMERGENCY DEPARTMENT AT [facility] Provider Note   CSN: [id number] Arrival date & time: 02/28/23  1434     History  Chief Complaint  Patient presents with   Back Pain    Carlos Horton is a 22 y.o. male.  Since the ER for left low back pain that occurred today while he was straining to have a bowel movement, suddenly felt a spasm in the low back.  He states he is able to stretch it and get it to relax but when he stood up it was still sore and hurts when he twists side-to-side or bends over.  Is able to walk, has no saddle anesthesia or paresthesia, no bowel or bladder incontinence, no fevers or chills or weight loss.  Denies any trauma to his back.  No radiation of pain into his legs.  No dysuria or hematuria.  Back Pain      Home Medications Prior to Admission medications   Medication Sig Start Date End Date Taking? Authorizing Provider  methocarbamol (ROBAXIN) 500 MG tablet Take 1 tablet (500 mg total) by mouth 2 (two) times daily. 02/28/23  Yes Quientin Jent A, PA-C  naproxen (NAPROSYN) 500 MG tablet Take 1 tablet (500 mg total) by mouth 2 (two) times daily. 02/28/23  Yes Shaneka Efaw A, PA-C  albuterol (VENTOLIN HFA) 108 (90 Base) MCG/ACT inhaler Inhale 2 puffs into the lungs every 4 (four) hours as needed for wheezing. 02/08/20   Bobbie Stack, MD  busPIRone (BUSPAR) 5 MG tablet Take 1 tablet (5 mg total) by mouth 2 (two) times daily. 03/30/22   Gilmore Laroche, FNP  DULoxetine (CYMBALTA) 30 MG capsule Take 1 capsule (30 mg total) by mouth daily. 03/30/22   Gilmore Laroche, FNP  gabapentin (NEURONTIN) 100 MG capsule Take 1 capsule (100 mg total) by mouth 3 (three) times daily. 07/07/21   Massengill, Harrold Donath, MD  traZODone (DESYREL) 50 MG tablet Take 1 tablet (50 mg total) by mouth at bedtime as needed for sleep. 07/07/21   Massengill, Harrold Donath, MD  Vitamin D, Ergocalciferol, (DRISDOL) 1.25 MG (50000 UNIT) CAPS capsule Take 1 capsule (50,000 Units total) by  mouth every 7 (seven) days. 03/30/22   Gilmore Laroche, FNP      Allergies    Patient has no known allergies.    Review of Systems   Review of Systems  Musculoskeletal:  Positive for back pain.    Physical Exam Updated Vital Signs BP 116/63   Pulse 61   Temp 97.8 F (36.6 C) (Oral)   Resp 18   Ht 5\' 5"  (1.651 m)   Wt 80.7 kg   SpO2 98%   BMI 29.62 kg/m  Physical Exam Vitals and nursing note reviewed.  Constitutional:      General: He is not in acute distress.    Appearance: He is well-developed.  HENT:     Head: Normocephalic and atraumatic.     Mouth/Throat:     Mouth: Mucous membranes are moist.  Eyes:     Conjunctiva/sclera: Conjunctivae normal.  Cardiovascular:     Rate and Rhythm: Normal rate and regular rhythm.     Heart sounds: No murmur heard. Pulmonary:     Effort: Pulmonary effort is normal. No respiratory distress.     Breath sounds: Normal breath sounds.  Abdominal:     Palpations: Abdomen is soft.     Tenderness: There is no abdominal tenderness.  Musculoskeletal:        General: No swelling.  Cervical back: Neck supple.  Skin:    General: Skin is warm and dry.     Capillary Refill: Capillary refill takes less than 2 seconds.  Neurological:     General: No focal deficit present.     Mental Status: He is alert and oriented to person, place, and time.  Psychiatric:        Mood and Affect: Mood normal.     ED Results / Procedures / Treatments   Labs (all labs ordered are listed, but only abnormal results are displayed) Labs Reviewed - No data to display  EKG None  Radiology DG Lumbar Spine Complete  Result Date: 02/28/2023 CLINICAL DATA:  Lower back pain.  No recent injury. EXAM: LUMBAR SPINE - COMPLETE 4+ VIEW COMPARISON:  None Available. FINDINGS: There are 5 nonrib-bearing lumbar vertebrae. There is loss of lumbar lordosis, which may be on the basis of positioning or due to muscle spasm. No spondylolysis or spondylolisthesis.  Vertebral body heights are maintained. No aggressive osseous lesion. No degenerative changes. Intervertebral disc heights are maintained. Sacroiliac joints are symmetric. Visualized soft tissues are within normal limits. IMPRESSION: 1. Loss of lumbar lordosis, which may be on the basis of positioning or due to muscle spasm. 2. Otherwise unremarkable exam. Electronically Signed   By: Jules Schick M.D.   On: 02/28/2023 16:43    Procedures Procedures    Medications Ordered in ED Medications  ketorolac (TORADOL) tablet 10 mg (10 mg Oral Given 02/28/23 1607)  methocarbamol (ROBAXIN) tablet 500 mg (500 mg Oral Given 02/28/23 1607)    ED Course/ Medical Decision Making/ A&P                                 Medical Decision Making Ddx: Strain, fracture, contusion, UTI, dysuria, other ED course: Patient presents with low back pain today, history of no bowel movement, has history of constipation, states this is normal for him but he had cramping pain and felt like a spasm in his low back.  He states stretch out get it improved but when he stood up started having pain again.  No red flag signs, no trauma, no urinary retention or urinary incontinence.  Negative straight leg raises.  He has no significant PMH aside from bipolar disorder that he is medicated for.  Denies IV drug abuse.  Do not feel he needs further labs or workup, will treat with NSAIDs and muscle relaxers and have him follow-up closely with PCP, given strict return precautions.  X-ray shows no fracture or traumatic malalignment, mild straightening of lumbar lordosis.  This is my independent interpretation agree with radiology read.  Amount and/or Complexity of Data Reviewed Radiology: ordered.  Risk Prescription drug management.           Final Clinical Impression(s) / ED Diagnoses Final diagnoses:  Acute left-sided low back pain without sciatica    Rx / DC Orders ED Discharge Orders          Ordered    methocarbamol  (ROBAXIN) 500 MG tablet  2 times daily        02/28/23 1658    naproxen (NAPROSYN) 500 MG tablet  2 times daily        02/28/23 [address] PA-C 02/28/23 1713    Glendora Score, MD 02/28/23 2238

## 2023-02-28 NOTE — ED Notes (Signed)
Introduced self to pt Pt able to walk from VT2 to FT 21 Pt stated that he has mid back muscle pain and tightness that started this morning after he woke up Pain 10/10 Denies numbness in legs and feet Denies any recent trauma's or falls.  Attached to partial monitor Waiting on Provider eval and XRAy results.

## 2023-02-28 NOTE — Discharge Instructions (Addendum)
You are seen today for back pain.  Your symptoms and are consistent with muscle strain and spasm.  We are prescribing you a muscle relaxer and naproxen to help with the pain.  He can also use over-the-counter lidocaine patches from the pharmacy which may help.  You can also try gentle stretching.  Follow-up close with your primary care doctor.  If you develop any new or worsening symptoms come back to the ER right away.  [facility] Primary Care Doctor List    Syliva Overman, MD. Specialty: [facility] Medicine Contact information: [address]  [place] [address]  [phone]   Lilyan Punt, MD. Specialty: [facility] Contact information: [address]  [place] [address]  [phone]   Avon Gully, MD Specialty: Internal Medicine Contact information: [address] [place] [address]  [phone]   Catalina Pizza, MD. Specialty: Internal Medicine Contact information: [address]  [place] [address]  [phone]    [facility] (Dr. Selena Batten) Specialty: Family Medicine Contact information: [address]  [place] [address]  [phone]   John Giovanni, MD. Specialty: [facility] Medicine Contact information: [address]  [address]  [place] [address]  [phone]   Carylon Perches, MD. Specialty: Internal Medicine Contact information: [address]  [address]  [place] [address]  [phone]    [facility]  [address] [place], [place] [address] [phone]  Services [facility] offers a variety of basic health services.  Services include but are not limited to: Blood pressure checks  Heart rate checks  Blood sugar checks  Urine analysis  Rapid strep tests  Pregnancy tests.  Health education and referrals  People needing more complex services will be directed to a physician online. Using these virtual visits, doctors  can evaluate and prescribe medicine and treatments. There will be no medication on-site, though [facility] will help patients fill their prescriptions at little to no cost.   For More information please go to: [email or web address]

## 2023-04-13 ENCOUNTER — Ambulatory Visit
Admission: EM | Admit: 2023-04-13 | Discharge: 2023-04-13 | Disposition: A | Discharge status: Home / Self Care | Payer: Medicaid Other | Attending: Nurse Practitioner | Admitting: Nurse Practitioner

## 2023-04-13 DIAGNOSIS — J029 Acute pharyngitis, unspecified: Secondary | ICD-10-CM | POA: Insufficient documentation

## 2023-04-13 DIAGNOSIS — J069 Acute upper respiratory infection, unspecified: Secondary | ICD-10-CM | POA: Diagnosis not present

## 2023-04-13 LAB — POCT RAPID STREP A (OFFICE): Rapid Strep A Screen: NEGATIVE

## 2023-04-13 MED ORDER — PROMETHAZINE-DM 6.25-15 MG/5ML PO SYRP: 5.0000 mL | ORAL_SOLUTION | Freq: Four times a day (QID) | ORAL | 0 refills | Status: AC | PRN

## 2023-04-13 MED ORDER — LIDOCAINE VISCOUS HCL 2 % MT SOLN: OROMUCOSAL | 0 refills | Status: AC
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
## 2023-04-13 NOTE — Discharge Instructions (Signed)
The rapid strep test was negative, a throat culture is pending.  You will be contacted if the pending test result is positive.  You will also have access to the results via MyChart. Take medication as prescribed. May take over-the-counter Tylenol or ibuprofen as needed for pain, fever, general discomfort. Increase fluids and allow for plenty of rest. Warm salt water gargles 3-4 times daily as needed for throat pain or discomfort. Recommend a soft diet to include soup, broth, yogurt, pudding, or Jell-O while symptoms persist. Chloraseptic throat spray or throat lozenges may be helpful for throat pain. Also recommend using a humidifier in your bedroom at nighttime during sleep or sleeping elevated on pillows while symptoms persist. If your symptoms have not improved over the next 7 to 10 days, or appear to be worsening before that time, you may follow-up in this clinic or with your primary care physician for further evaluation. Follow-up as needed.

## 2023-04-13 NOTE — ED Triage Notes (Signed)
Pt reports he has chest discomfort, headaches, cough, n/v, sore throat, and runny nose x 3 days  Covid test was neg

## 2023-04-16 LAB — CULTURE, GROUP A STREP (THRC)

## 2023-06-23 ENCOUNTER — Other Ambulatory Visit: Payer: Self-pay

## 2023-06-23 ENCOUNTER — Emergency Department (HOSPITAL_COMMUNITY): Payer: Medicaid Other

## 2023-06-23 ENCOUNTER — Emergency Department (HOSPITAL_COMMUNITY)
Admission: EM | Admit: 2023-06-23 | Discharge: 2023-06-23 | Disposition: A | Discharge status: Home / Self Care | Payer: Medicaid Other | Attending: Emergency Medicine | Admitting: Emergency Medicine

## 2023-06-23 ENCOUNTER — Encounter (HOSPITAL_COMMUNITY): Payer: Self-pay | Admitting: *Deleted

## 2023-06-23 DIAGNOSIS — N201 Calculus of ureter: Secondary | ICD-10-CM | POA: Insufficient documentation

## 2023-06-23 DIAGNOSIS — N202 Calculus of kidney with calculus of ureter: Secondary | ICD-10-CM | POA: Diagnosis not present

## 2023-06-23 DIAGNOSIS — R109 Unspecified abdominal pain: Secondary | ICD-10-CM | POA: Diagnosis not present

## 2023-06-23 DIAGNOSIS — R300 Dysuria: Secondary | ICD-10-CM | POA: Diagnosis present

## 2023-06-23 LAB — URINALYSIS, ROUTINE W REFLEX MICROSCOPIC
Bacteria, UA: NONE SEEN
Bilirubin Urine: NEGATIVE
Glucose, UA: NEGATIVE mg/dL
Ketones, ur: NEGATIVE mg/dL
Leukocytes,Ua: NEGATIVE
Nitrite: NEGATIVE
Protein, ur: NEGATIVE mg/dL
RBC / HPF: >50 RBC/hpf (ref 0–5)
Specific Gravity, Urine: 1.019 (ref 1.005–1.030)
pH: 6 (ref 5.0–8.0)

## 2023-06-23 LAB — CBC WITH DIFFERENTIAL/PLATELET
Abs Immature Granulocytes: 0.04 10*3/uL (ref 0.00–0.07)
Basophils Absolute: 0.1 10*3/uL (ref 0.0–0.1)
Basophils Relative: 1 %
Eosinophils Absolute: 0.1 10*3/uL (ref 0.0–0.5)
Eosinophils Relative: 1 %
HCT: 44.7 % (ref 39.0–52.0)
Hemoglobin: 15.2 g/dL (ref 13.0–17.0)
Immature Granulocytes: 1 %
Lymphocytes Relative: 28 %
Lymphs Abs: 2.3 10*3/uL (ref 0.7–4.0)
MCH: 31.2 pg (ref 26.0–34.0)
MCHC: 34 g/dL (ref 30.0–36.0)
MCV: 91.8 fL (ref 80.0–100.0)
Monocytes Absolute: 0.9 10*3/uL (ref 0.1–1.0)
Monocytes Relative: 11 %
Neutro Abs: 4.8 10*3/uL (ref 1.7–7.7)
Neutrophils Relative %: 58 %
Platelets: 303 10*3/uL (ref 150–400)
RBC: 4.87 MIL/uL (ref 4.22–5.81)
RDW: 11.7 % (ref 11.5–15.5)
WBC: 8.1 10*3/uL (ref 4.0–10.5)
nRBC: 0 % (ref 0.0–0.2)

## 2023-06-23 LAB — BASIC METABOLIC PANEL
Anion gap: 8 (ref 5–15)
BUN: 15 mg/dL (ref 6–20)
CO2: 25 mmol/L (ref 22–32)
Calcium: 9.1 mg/dL (ref 8.9–10.3)
Chloride: 106 mmol/L (ref 98–111)
Creatinine, Ser: 0.85 mg/dL (ref 0.61–1.24)
GFR, Estimated: >60 mL/min (ref >60)
Glucose, Bld: 111 mg/dL — ABNORMAL HIGH (ref 70–99)
Potassium: 3.6 mmol/L (ref 3.5–5.1)
Sodium: 139 mmol/L (ref 135–145)

## 2023-06-23 MED ORDER — IBUPROFEN 400 MG PO TABS
600.0000 mg | ORAL_TABLET | Freq: Once | ORAL | Status: AC
  Administered 2023-06-23: 600 mg via ORAL
  Filled 2023-06-23: qty 2

## 2023-06-23 MED ORDER — NAPROXEN 500 MG PO TABS: 500.0000 mg | ORAL_TABLET | Freq: Two times a day (BID) | ORAL | 0 refills | Status: DC

## 2023-06-23 MED ORDER — TAMSULOSIN HCL 0.4 MG PO CAPS: 0.4000 mg | ORAL_CAPSULE | Freq: Every day | ORAL | 0 refills | Status: DC

## 2023-06-23 NOTE — ED Provider Triage Note (Signed)
Emergency Medicine Provider Triage Evaluation Note  Carlos Horton , a 23 y.o. male  was evaluated in triage.  Pt complains of urinary frequency and urgency, feels the urge to urinate but states only small amounts come out, started this morning, has been having back pain for couple days, been drinking lots of water since his back started hurting.  Denies fever or chills, no history of kidney stones, no nausea or vomiting..  Review of Systems  Positive: Flank pain, urinary frequency Negative: As above  Physical Exam  BP 136/72 (BP Location: Right Arm)   Pulse 84   Temp 98.2 F (36.8 C) (Oral)   Resp 18   Ht 5\' 5"  (1.651 m)   Wt 77.1 kg   SpO2 98%   BMI 28.29 kg/m  Gen:   Awake, no distress   Resp:  Normal effort  MSK:   Moves extremities without difficulty  Other:    Medical Decision Making  Medically screening exam initiated at 4:01 PM.  Appropriate orders placed.  Carlos Horton was informed that the remainder of the evaluation will be completed by another provider, this initial triage assessment does not replace that evaluation, and the importance of remaining in the ED until their evaluation is complete.     Ma Rings, [place] 06/23/23 [phone]

## 2023-06-23 NOTE — ED Provider Notes (Signed)
[place] EMERGENCY DEPARTMENT AT [facility] Provider Note   CSN: [id number] Arrival date & time: 06/23/23  1530     History  Chief Complaint  Patient presents with   Dysuria    SMARAN GAUS is a 23 y.o. male.Wenda Overland , a 23 y.o. male  was evaluated in triage.  Pt complains of urinary frequency and urgency, feels the urge to urinate but states only small amounts come out, started this morning, has been having back pain for couple days, been drinking lots of water since his back started hurting.  Denies fever or chills, no history of kidney stones, no nausea or vomiting.    Dysuria Presenting symptoms: dysuria        Home Medications Prior to Admission medications   Medication Sig Start Date End Date Taking? Authorizing Provider  tamsulosin (FLOMAX) 0.4 MG CAPS capsule Take 1 capsule (0.4 mg total) by mouth daily after breakfast. 06/23/23  Yes Lindsay Straka A, PA-C  albuterol (VENTOLIN HFA) 108 (90 Base) MCG/ACT inhaler Inhale 2 puffs into the lungs every 4 (four) hours as needed for wheezing. 02/08/20   Bobbie Stack, MD  busPIRone (BUSPAR) 5 MG tablet Take 1 tablet (5 mg total) by mouth 2 (two) times daily. 03/30/22   Gilmore Laroche, FNP  DULoxetine (CYMBALTA) 30 MG capsule Take 1 capsule (30 mg total) by mouth daily. 03/30/22   Gilmore Laroche, FNP  gabapentin (NEURONTIN) 100 MG capsule Take 1 capsule (100 mg total) by mouth 3 (three) times daily. 07/07/21   Massengill, Harrold Donath, MD  lidocaine (XYLOCAINE) 2 % solution Gargle and spit 5 mL every 6 hours as needed for throat pain or discomfort. 04/13/23   Leath-Warren, Sadie Haber, NP  methocarbamol (ROBAXIN) 500 MG tablet Take 1 tablet (500 mg total) by mouth 2 (two) times daily. 02/28/23   Carmel Sacramento A, PA-C  naproxen (NAPROSYN) 500 MG tablet Take 1 tablet (500 mg total) by mouth 2 (two) times daily. 06/23/23   Carmel Sacramento A, PA-C  promethazine-dextromethorphan (PROMETHAZINE-DM) 6.25-15 MG/5ML syrup Take 5 mLs  by mouth 4 (four) times daily as needed. 04/13/23   Leath-Warren, Sadie Haber, NP  traZODone (DESYREL) 50 MG tablet Take 1 tablet (50 mg total) by mouth at bedtime as needed for sleep. 07/07/21   Massengill, Harrold Donath, MD  Vitamin D, Ergocalciferol, (DRISDOL) 1.25 MG (50000 UNIT) CAPS capsule Take 1 capsule (50,000 Units total) by mouth every 7 (seven) days. 03/30/22   Gilmore Laroche, FNP      Allergies    Patient has no known allergies.    Review of Systems   Review of Systems  Genitourinary:  Positive for dysuria.    Physical Exam Updated Vital Signs BP 136/72 (BP Location: Right Arm)   Pulse 84   Temp 98.2 F (36.8 C) (Oral)   Resp 18   Ht 5\' 5"  (1.651 m)   Wt 77.1 kg   SpO2 98%   BMI 28.29 kg/m  Physical Exam Vitals and nursing note reviewed.  Constitutional:      General: He is not in acute distress.    Appearance: He is well-developed.  HENT:     Head: Normocephalic and atraumatic.     Mouth/Throat:     Mouth: Mucous membranes are moist.  Eyes:     Extraocular Movements: Extraocular movements intact.     Conjunctiva/sclera: Conjunctivae normal.     Pupils: Pupils are equal, round, and reactive to light.  Cardiovascular:     Rate and  Rhythm: Normal rate and regular rhythm.     Heart sounds: No murmur heard. Pulmonary:     Effort: Pulmonary effort is normal. No respiratory distress.     Breath sounds: Normal breath sounds.  Abdominal:     Palpations: Abdomen is soft.     Tenderness: There is no abdominal tenderness.  Musculoskeletal:        General: No swelling.     Cervical back: Neck supple.  Skin:    General: Skin is warm and dry.     Capillary Refill: Capillary refill takes less than 2 seconds.  Neurological:     General: No focal deficit present.     Mental Status: He is alert and oriented to person, place, and time.  Psychiatric:        Mood and Affect: Mood normal.     ED Results / Procedures / Treatments   Labs (all labs ordered are listed, but  only abnormal results are displayed) Labs Reviewed  URINALYSIS, ROUTINE W REFLEX MICROSCOPIC - Abnormal; Notable for the following components:      Result Value   Hgb urine dipstick MODERATE (*)    All other components within normal limits  BASIC METABOLIC PANEL - Abnormal; Notable for the following components:   Glucose, Bld 111 (*)    All other components within normal limits  CBC WITH DIFFERENTIAL/PLATELET    EKG None  Radiology CT Renal Stone Study Result Date: 06/23/2023 CLINICAL DATA:  Back pain x2 days, flank pain, suspected stone EXAM: CT ABDOMEN AND PELVIS WITHOUT CONTRAST TECHNIQUE: Multidetector CT imaging of the abdomen and pelvis was performed following the standard protocol without IV contrast. RADIATION DOSE REDUCTION: This exam was performed according to the departmental dose-optimization program which includes automated exposure control, adjustment of the mA and/or kV according to patient size and/or use of iterative reconstruction technique. COMPARISON:  None Available. FINDINGS: Lower chest: No pleural or pericardial effusion. Visualized lung bases clear. Hepatobiliary: No focal liver abnormality is seen. No gallstones, gallbladder wall thickening, or biliary dilatation. Pancreas: Unremarkable. No pancreatic ductal dilatation or surrounding inflammatory changes. Spleen: Normal in size without focal abnormality. Adrenals/Urinary Tract: 3 mm calculus, upper pole left renal collecting system. 3 mm distal left ureteral calculus. No hydronephrosis. Urinary bladder nondistended. Stomach/Bowel: Stomach is physiologically distended. Small bowel decompressed. Normal appendix. The colon is partially distended, unremarkable. Vascular/Lymphatic: No significant vascular findings are present. No enlarged abdominal or pelvic lymph nodes. Reproductive: Prostate is unremarkable. Other: Right pelvic phlebolith.  No ascites.  No free air. Musculoskeletal: No acute or significant osseous findings.  IMPRESSION: 1. 3 mm distal left ureteral calculus without hydronephrosis. 2. 3 mm left nephrolithiasis. Electronically Signed   By: Corlis Leak M.D.   On: 06/23/2023 16:22    Procedures Procedures    Medications Ordered in ED Medications  ibuprofen (ADVIL) tablet 600 mg (600 mg Oral Given 06/23/23 1655)    ED Course/ Medical Decision Making/ A&P                                 Medical Decision Making This patient presents to the ED for concern of urinary frequency, low back pain, this involves an extensive number of treatment options, and is a complaint that carries with it a high risk of complications and morbidity.  The differential diagnosis includes muscle strain, UTI, ureterolithiasis, prostatitis, other    Additional history obtained:  Additional history obtained from EMR  External records from outside source obtained and reviewed including prior notes   Lab Tests:  I Ordered, and personally interpreted labs.  The pertinent results include: Analysis positive for hematuria otherwise normal, no UTI, CBC and BMP are normal   Imaging Studies ordered:  I ordered imaging studies including CT abdomen pelvis which shows millimeter left distal ureter stone I independently visualized and interpreted imaging within scope of identifying emergent findings  I agree with the radiologist interpretation    Problem List / ED Course / Critical interventions / Medication management  Patient having urinary frequency and low back pain for several days, has hematuria but no UTI, normal CBC and BMP, CT ordered due to suspicion of ureterolithiasis does reveal a left 3 mm ureteral stone.  Patient feels better after ibuprofen, given Flomax, ibuprofen, for home and advised on follow-up with urology and strict return precautions.   I have reviewed the patients home medicines and have made adjustments as needed     Amount and/or Complexity of Data Reviewed Labs: ordered. Radiology:  ordered.  Risk Prescription drug management.           Final Clinical Impression(s) / ED Diagnoses Final diagnoses:  Ureterolithiasis    Rx / DC Orders ED Discharge Orders          Ordered    tamsulosin (FLOMAX) 0.4 MG CAPS capsule  Daily after breakfast        06/23/23 1715    naproxen (NAPROSYN) 500 MG tablet  2 times daily        06/23/23 [address] 06/23/23 2235    Eber Hong, MD 06/23/23 [phone]

## 2023-06-23 NOTE — ED Triage Notes (Signed)
Pt with urinary frequency, emesis x 2 today.  C/o back pain 2 days ago.  Discomfort with urination one time earlier. Denies any discharge

## 2023-06-23 NOTE — Discharge Instructions (Signed)
You are seen in the ER today for back pain and trouble urinating.  You are found to have a small kidney stone on the left side.  We are giving you medication for pain and a medication called Flomax which may help you pass the stone slightly faster.   Follow-up with urology, come back to the ER for new or worsening symptoms. Can take over-the-counter Tylenol as directed on packaging as well for any discomfort, make sure he drinks lots of water.

## 2023-06-24 ENCOUNTER — Encounter (HOSPITAL_COMMUNITY): Payer: Self-pay | Admitting: Emergency Medicine

## 2023-06-24 ENCOUNTER — Other Ambulatory Visit: Payer: Self-pay

## 2023-06-24 ENCOUNTER — Emergency Department (HOSPITAL_COMMUNITY)
Admission: EM | Admit: 2023-06-24 | Discharge: 2023-06-24 | Disposition: A | Discharge status: Home / Self Care | Payer: Medicaid Other | Attending: Emergency Medicine | Admitting: Emergency Medicine

## 2023-06-24 DIAGNOSIS — N200 Calculus of kidney: Secondary | ICD-10-CM | POA: Diagnosis not present

## 2023-06-24 MED ORDER — OXYCODONE-ACETAMINOPHEN 5-325 MG PO TABS: 1.0000 | ORAL_TABLET | Freq: Four times a day (QID) | ORAL | 0 refills | Status: DC | PRN

## 2023-06-24 MED ORDER — OXYCODONE-ACETAMINOPHEN 5-325 MG PO TABS
2.0000 | ORAL_TABLET | Freq: Once | ORAL | Status: AC
  Administered 2023-06-24: 2 via ORAL
  Filled 2023-06-24: qty 2

## 2023-06-24 MED ORDER — ONDANSETRON 4 MG PO TBDP: 4.0000 mg | ORAL_TABLET | Freq: Three times a day (TID) | ORAL | 0 refills | Status: DC | PRN

## 2023-06-24 NOTE — ED Triage Notes (Signed)
Pt seen yesterday for kidney stone and urinary frequency yesterday. States he was sent home with naproxen but that is not touching his pain. Endorses testicle pain.

## 2023-06-24 NOTE — ED Provider Notes (Signed)
[place] EMERGENCY DEPARTMENT AT [facility] Provider Note   CSN: [id number] Arrival date & time: 06/24/23  1248     History  Chief Complaint  Patient presents with   Nephrolithiasis    Carlos Horton is a 23 y.o. male.  HPI   23 year old male seen yesterday for kidney stone, has a distal left kidney stone, was given Naprosyn and Flomax but has been worsening pain today.  No tenderness on exam, no fevers or chills, no nausea or vomiting  Home Medications Prior to Admission medications   Medication Sig Start Date End Date Taking? Authorizing Provider  ondansetron (ZOFRAN-ODT) 4 MG disintegrating tablet Take 1 tablet (4 mg total) by mouth every 8 (eight) hours as needed for nausea. 06/24/23  Yes Eber Hong, MD  oxyCODONE-acetaminophen (PERCOCET/ROXICET) 5-325 MG tablet Take 1 tablet by mouth every 6 (six) hours as needed for severe pain (pain score 7-10). 06/24/23  Yes Eber Hong, MD  albuterol (VENTOLIN HFA) 108 (90 Base) MCG/ACT inhaler Inhale 2 puffs into the lungs every 4 (four) hours as needed for wheezing. 02/08/20   Bobbie Stack, MD  busPIRone (BUSPAR) 5 MG tablet Take 1 tablet (5 mg total) by mouth 2 (two) times daily. 03/30/22   Gilmore Laroche, FNP  DULoxetine (CYMBALTA) 30 MG capsule Take 1 capsule (30 mg total) by mouth daily. 03/30/22   Gilmore Laroche, FNP  gabapentin (NEURONTIN) 100 MG capsule Take 1 capsule (100 mg total) by mouth 3 (three) times daily. 07/07/21   Massengill, Harrold Donath, MD  lidocaine (XYLOCAINE) 2 % solution Gargle and spit 5 mL every 6 hours as needed for throat pain or discomfort. 04/13/23   Leath-Warren, Sadie Haber, NP  methocarbamol (ROBAXIN) 500 MG tablet Take 1 tablet (500 mg total) by mouth 2 (two) times daily. 02/28/23   Carmel Sacramento A, PA-C  naproxen (NAPROSYN) 500 MG tablet Take 1 tablet (500 mg total) by mouth 2 (two) times daily. 06/23/23   Carmel Sacramento A, PA-C  promethazine-dextromethorphan (PROMETHAZINE-DM) 6.25-15 MG/5ML syrup  Take 5 mLs by mouth 4 (four) times daily as needed. 04/13/23   Leath-Warren, Sadie Haber, NP  tamsulosin (FLOMAX) 0.4 MG CAPS capsule Take 1 capsule (0.4 mg total) by mouth daily after breakfast. 06/23/23   Carmel Sacramento A, PA-C  traZODone (DESYREL) 50 MG tablet Take 1 tablet (50 mg total) by mouth at bedtime as needed for sleep. 07/07/21   Massengill, Harrold Donath, MD  Vitamin D, Ergocalciferol, (DRISDOL) 1.25 MG (50000 UNIT) CAPS capsule Take 1 capsule (50,000 Units total) by mouth every 7 (seven) days. 03/30/22   Gilmore Laroche, FNP      Allergies    Patient has no known allergies.    Review of Systems   Review of Systems  All other systems reviewed and are negative.   Physical Exam Updated Vital Signs BP (!) 142/75   Pulse 80   Temp 98.1 F (36.7 C) (Oral)   Resp 20   Ht 1.651 m (5\' 5" )   Wt 77 kg   SpO2 100%   BMI 28.25 kg/m  Physical Exam Vitals and nursing note reviewed.  Constitutional:      General: He is not in acute distress.    Appearance: He is well-developed.  HENT:     Head: Normocephalic and atraumatic.     Mouth/Throat:     Pharynx: No oropharyngeal exudate.  Eyes:     General: No scleral icterus.       Right eye: No discharge.  Left eye: No discharge.     Conjunctiva/sclera: Conjunctivae normal.     Pupils: Pupils are equal, round, and reactive to light.  Neck:     Thyroid: No thyromegaly.     Vascular: No JVD.  Cardiovascular:     Rate and Rhythm: Normal rate and regular rhythm.     Heart sounds: Normal heart sounds. No murmur heard.    No friction rub. No gallop.  Pulmonary:     Effort: Pulmonary effort is normal. No respiratory distress.     Breath sounds: Normal breath sounds. No wheezing or rales.  Abdominal:     General: Bowel sounds are normal. There is no distension.     Palpations: Abdomen is soft. There is no mass.     Tenderness: There is no abdominal tenderness.  Musculoskeletal:        General: No tenderness. Normal range of  motion.     Cervical back: Normal range of motion and neck supple.  Lymphadenopathy:     Cervical: No cervical adenopathy.  Skin:    General: Skin is warm and dry.     Findings: No erythema or rash.  Neurological:     Mental Status: He is alert.     Coordination: Coordination normal.  Psychiatric:        Behavior: Behavior normal.     ED Results / Procedures / Treatments   Labs (all labs ordered are listed, but only abnormal results are displayed) Labs Reviewed - No data to display  EKG None  Radiology CT Renal Stone Study Result Date: 06/23/2023 CLINICAL DATA:  Back pain x2 days, flank pain, suspected stone EXAM: CT ABDOMEN AND PELVIS WITHOUT CONTRAST TECHNIQUE: Multidetector CT imaging of the abdomen and pelvis was performed following the standard protocol without IV contrast. RADIATION DOSE REDUCTION: This exam was performed according to the departmental dose-optimization program which includes automated exposure control, adjustment of the mA and/or kV according to patient size and/or use of iterative reconstruction technique. COMPARISON:  None Available. FINDINGS: Lower chest: No pleural or pericardial effusion. Visualized lung bases clear. Hepatobiliary: No focal liver abnormality is seen. No gallstones, gallbladder wall thickening, or biliary dilatation. Pancreas: Unremarkable. No pancreatic ductal dilatation or surrounding inflammatory changes. Spleen: Normal in size without focal abnormality. Adrenals/Urinary Tract: 3 mm calculus, upper pole left renal collecting system. 3 mm distal left ureteral calculus. No hydronephrosis. Urinary bladder nondistended. Stomach/Bowel: Stomach is physiologically distended. Small bowel decompressed. Normal appendix. The colon is partially distended, unremarkable. Vascular/Lymphatic: No significant vascular findings are present. No enlarged abdominal or pelvic lymph nodes. Reproductive: Prostate is unremarkable. Other: Right pelvic phlebolith.  No  ascites.  No free air. Musculoskeletal: No acute or significant osseous findings. IMPRESSION: 1. 3 mm distal left ureteral calculus without hydronephrosis. 2. 3 mm left nephrolithiasis. Electronically Signed   By: Corlis Leak M.D.   On: 06/23/2023 16:22    Procedures Procedures    Medications Ordered in ED Medications  oxyCODONE-acetaminophen (PERCOCET/ROXICET) 5-325 MG per tablet 2 tablet (has no administration in time range)    ED Course/ Medical Decision Making/ A&P                                 Medical Decision Making Risk Prescription drug management.   The patient had a complete workup yesterday, no fever or infectious symptoms, add Percocet, patient stable for discharge, agreeable to the plan, 2 Percocet given prior to discharge  Final Clinical Impression(s) / ED Diagnoses Final diagnoses:  Kidney stone on left side    Rx / DC Orders ED Discharge Orders          Ordered    oxyCODONE-acetaminophen (PERCOCET/ROXICET) 5-325 MG tablet  Every 6 hours PRN        06/24/23 1800    ondansetron (ZOFRAN-ODT) 4 MG disintegrating tablet  Every 8 hours PRN        06/24/23 1800              Eber Hong, MD 06/24/23 1800

## 2023-06-24 NOTE — Discharge Instructions (Addendum)
Opiate medications such as Percocet or Vicodin or morphine are very strong pain medications that are also very addictive if they are taken for too long, even a single dose can predispose someone to become addicted so be very careful when taking this medication.  You should take the smallest amount possible to relieve your pain, these medications may cause constipation or nausea, please follow-up with your doctor if you are having the need for ongoing pain control despite these medications.  Additionally please be aware that these medications may cause sedation or sleepiness or alter judgment so you should not take this if you are driving a vehicle or operating heavy machinery or taking care of small children.  Zofran is a medication which can help with nausea.  You may take 4 mg by mouth every 6 hours as needed if you are an adult, if your child under the age of 6 take half of a tablet or 2 mg every 6 hours as needed.  This should dissolve on your tongue within a short timeframe.  Wait about 30 minutes after taking it to help with drinking clear liquids.  Thank you for allowing [place] to treat you in the emergency department today.  After reviewing your examination and potential testing that was done it appears that you are safe to go home.  I would like for you to follow-up with your doctor within the next several days, have them obtain your records and follow-up with them to review all potential tests and results from your visit.  If you should develop severe or worsening symptoms return to the emergency department immediately

## 2023-06-28 ENCOUNTER — Other Ambulatory Visit: Payer: Self-pay

## 2023-06-28 ENCOUNTER — Encounter (HOSPITAL_COMMUNITY): Payer: Self-pay

## 2023-06-28 ENCOUNTER — Emergency Department (HOSPITAL_COMMUNITY)
Admission: EM | Admit: 2023-06-28 | Discharge: 2023-06-29 | Disposition: A | Discharge status: Home / Self Care | Payer: Medicaid Other | Attending: Emergency Medicine | Admitting: Emergency Medicine

## 2023-06-28 DIAGNOSIS — N201 Calculus of ureter: Secondary | ICD-10-CM | POA: Insufficient documentation

## 2023-06-28 DIAGNOSIS — J45909 Unspecified asthma, uncomplicated: Secondary | ICD-10-CM | POA: Diagnosis not present

## 2023-06-28 DIAGNOSIS — Z760 Encounter for issue of repeat prescription: Secondary | ICD-10-CM | POA: Diagnosis present

## 2023-06-28 HISTORY — DX: Calculus of kidney: N20.0

## 2023-06-28 NOTE — ED Triage Notes (Signed)
 Pt to ED from home with dx kidney stone her on 2/3- pt is out of pain meds, has appt next Thursday with urology, but only has one pain pill left and is still in pain and having blood in urine.

## 2023-06-29 MED ORDER — OXYCODONE-ACETAMINOPHEN 5-325 MG PO TABS
1.0000 | ORAL_TABLET | Freq: Once | ORAL | Status: AC
  Administered 2023-06-29: 1 via ORAL
  Filled 2023-06-29: qty 1

## 2023-06-29 MED ORDER — OXYCODONE-ACETAMINOPHEN 5-325 MG PO TABS: 1.0000 | ORAL_TABLET | Freq: Four times a day (QID) | ORAL | 0 refills | Status: DC | PRN

## 2023-06-29 MED ORDER — TAMSULOSIN HCL 0.4 MG PO CAPS: 0.4000 mg | ORAL_CAPSULE | Freq: Every day | ORAL | 0 refills | Status: DC

## 2023-06-29 NOTE — ED Provider Notes (Signed)
 [place] EMERGENCY DEPARTMENT AT [facility] Provider Note   CSN: [id number] Arrival date & time: 06/28/23  2112     History  Chief Complaint  Patient presents with   Medication Refill    Carlos Horton is a 23 y.o. male.  The history is provided by the patient.  Medication Refill He has history of asthma, attention deficit disorder, kidney stones and is scheduled to see his urologist on 07/04/2023.  He comes in because he has run out of tamsulosin  and oxycodone -acetaminophen  and he is continuing to have back pain.  He is also noted that his urine is looking more red.  He denies nausea or vomiting and denies fever or chills.   Home Medications Prior to Admission medications   Medication Sig Start Date End Date Taking? Authorizing Provider  albuterol  (VENTOLIN  HFA) 108 (90 Base) MCG/ACT inhaler Inhale 2 puffs into the lungs every 4 (four) hours as needed for wheezing. 02/08/20   Rendell Grumet, MD  busPIRone  (BUSPAR ) 5 MG tablet Take 1 tablet (5 mg total) by mouth 2 (two) times daily. 03/30/22   Zarwolo, Gloria, FNP  DULoxetine  (CYMBALTA ) 30 MG capsule Take 1 capsule (30 mg total) by mouth daily. 03/30/22   Zarwolo, Gloria, FNP  gabapentin  (NEURONTIN ) 100 MG capsule Take 1 capsule (100 mg total) by mouth 3 (three) times daily. 07/07/21   Massengill, Rankin, MD  lidocaine  (XYLOCAINE ) 2 % solution Gargle and spit 5 mL every 6 hours as needed for throat pain or discomfort. 04/13/23   Leath-Warren, Etta JINNY, NP  methocarbamol  (ROBAXIN ) 500 MG tablet Take 1 tablet (500 mg total) by mouth 2 (two) times daily. 02/28/23   Suellen Cantor A, PA-C  naproxen  (NAPROSYN ) 500 MG tablet Take 1 tablet (500 mg total) by mouth 2 (two) times daily. 06/23/23   Suellen Cantor A, PA-C  ondansetron  (ZOFRAN -ODT) 4 MG disintegrating tablet Take 1 tablet (4 mg total) by mouth every 8 (eight) hours as needed for nausea. 06/24/23   Cleotilde Rogue, MD  oxyCODONE -acetaminophen  (PERCOCET/ROXICET) 5-325 MG tablet  Take 1 tablet by mouth every 6 (six) hours as needed for severe pain (pain score 7-10). 06/24/23   Cleotilde Rogue, MD  promethazine -dextromethorphan (PROMETHAZINE -DM) 6.25-15 MG/5ML syrup Take 5 mLs by mouth 4 (four) times daily as needed. 04/13/23   Leath-Warren, Etta JINNY, NP  tamsulosin  (FLOMAX ) 0.4 MG CAPS capsule Take 1 capsule (0.4 mg total) by mouth daily after breakfast. 06/23/23   Suellen Cantor A, PA-C  traZODone  (DESYREL ) 50 MG tablet Take 1 tablet (50 mg total) by mouth at bedtime as needed for sleep. 07/07/21   Massengill, Rankin, MD  Vitamin D , Ergocalciferol , (DRISDOL ) 1.25 MG (50000 UNIT) CAPS capsule Take 1 capsule (50,000 Units total) by mouth every 7 (seven) days. 03/30/22   Zarwolo, Gloria, FNP      Allergies    Patient has no known allergies.    Review of Systems   Review of Systems  All other systems reviewed and are negative.   Physical Exam Updated Vital Signs BP (!) 155/80 (BP Location: Right Arm)   Pulse 75   Temp 98.7 F (37.1 C) (Oral)   Resp 16   Ht 5' 5 (1.651 m)   Wt 77 kg   SpO2 100%   BMI 28.25 kg/m  Physical Exam Vitals and nursing note reviewed.   22 year old male, resting comfortably and in no acute distress. Vital signs are significant for elevated blood pressure. Oxygen saturation is 100%, which is normal.  Head is normocephalic and atraumatic. PERRLA, EOMI. Oropharynx is clear. Neck is nontender and supple without adenopathy or JVD. Back is nontender and there is no CVA tenderness. Lungs are clear without rales, wheezes, or rhonchi. Chest is nontender. Heart has regular rate and rhythm without murmur. Abdomen is soft, flat, nontender. Neurologic: Mental status is normal, cranial nerves are intact, moves all extremities equally.  ED Results / Procedures / Treatments    Procedures Procedures    Medications Ordered in ED Medications  oxyCODONE -acetaminophen  (PERCOCET/ROXICET) 5-325 MG per tablet 1 tablet (has no administration in time  range)    ED Course/ Medical Decision Making/ A&P                                 Medical Decision Making  Ongoing pain from known ureterolithiasis.  I reviewed his past records, and he was seen in the ED on 06/23/2023 for left-sided ureterolithiasis and given prescription for 3 tamsulosin  tablets and also for naproxen , seen on 06/24/2023 and given prescriptions for 8 oxycodone -acetaminophen  tablets and ondansetron .  I have ordered a dose of oxycodone -acetaminophen  to be given here and I am discharging him with a prescription for 10 oxycodone -acetaminophen  tablets and also 10 tamsulosin  tablets.  He is to follow-up with urology as scheduled.  Advised to return for uncontrolled pain or for fever.  Final Clinical Impression(s) / ED Diagnoses Final diagnoses:  Ureterolithiasis    Rx / DC Orders ED Discharge Orders          Ordered    tamsulosin  (FLOMAX ) 0.4 MG CAPS capsule  Daily after breakfast        06/29/23 0113    oxyCODONE -acetaminophen  (PERCOCET/ROXICET) 5-325 MG tablet  Every 6 hours PRN        06/29/23 0117              Raford Lenis, MD 06/29/23 0124

## 2023-06-29 NOTE — Discharge Instructions (Signed)
Return if pain is not being adequately controlled at home, or if you start running a fever. 

## 2023-07-04 ENCOUNTER — Encounter: Payer: Self-pay | Admitting: Urology

## 2023-07-04 ENCOUNTER — Ambulatory Visit: Payer: Medicaid Other | Admitting: Urology

## 2023-07-04 VITALS — BP 120/76 | HR 86

## 2023-07-04 DIAGNOSIS — N201 Calculus of ureter: Secondary | ICD-10-CM

## 2023-07-04 DIAGNOSIS — Z87442 Personal history of urinary calculi: Secondary | ICD-10-CM | POA: Diagnosis not present

## 2023-07-04 DIAGNOSIS — Z09 Encounter for follow-up examination after completed treatment for conditions other than malignant neoplasm: Secondary | ICD-10-CM

## 2023-07-04 DIAGNOSIS — N200 Calculus of kidney: Secondary | ICD-10-CM
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
## 2023-07-05 ENCOUNTER — Other Ambulatory Visit: Payer: Self-pay

## 2023-07-05 ENCOUNTER — Emergency Department (HOSPITAL_COMMUNITY)
Admission: EM | Admit: 2023-07-05 | Discharge: 2023-07-06 | Disposition: A | Discharge status: Home / Self Care | Payer: Medicaid Other | Attending: Emergency Medicine | Admitting: Emergency Medicine

## 2023-07-05 ENCOUNTER — Encounter (HOSPITAL_COMMUNITY): Payer: Self-pay | Admitting: Emergency Medicine

## 2023-07-05 DIAGNOSIS — L03114 Cellulitis of left upper limb: Secondary | ICD-10-CM | POA: Diagnosis not present

## 2023-07-05 DIAGNOSIS — M7989 Other specified soft tissue disorders: Secondary | ICD-10-CM | POA: Diagnosis present

## 2023-07-05 LAB — URINALYSIS, ROUTINE W REFLEX MICROSCOPIC
Bilirubin, UA: NEGATIVE
Glucose, UA: NEGATIVE
Ketones, UA: NEGATIVE
Leukocytes,UA: NEGATIVE
Nitrite, UA: NEGATIVE
Protein,UA: NEGATIVE
Specific Gravity, UA: 1.03 (ref 1.005–1.030)
Urobilinogen, Ur: 0.2 mg/dL (ref 0.2–1.0)
pH, UA: 6 (ref 5.0–7.5)

## 2023-07-05 LAB — MICROSCOPIC EXAMINATION
Bacteria, UA: NONE SEEN
WBC, UA: NONE SEEN /[HPF] (ref 0–5)

## 2023-07-05 MED ORDER — DOXYCYCLINE HYCLATE 100 MG PO TABS
100.0000 mg | ORAL_TABLET | Freq: Once | ORAL | Status: AC
  Administered 2023-07-06: 100 mg via ORAL
  Filled 2023-07-05: qty 1

## 2023-07-05 MED ORDER — DOXYCYCLINE HYCLATE 100 MG PO CAPS
100.0000 mg | ORAL_CAPSULE | Freq: Two times a day (BID) | ORAL | 0 refills | Status: DC
Start: 2023-07-05 — End: 2023-09-14

## 2023-07-05 NOTE — ED Triage Notes (Signed)
Pt c/o possible tattoo infection to left forearm. States that the tattoo was 4 days ago, c/o increased burning and redness since getting the tattoo

## 2023-07-05 NOTE — ED Provider Notes (Signed)
[place] EMERGENCY DEPARTMENT AT [facility] Provider Note   CSN: [id number] Arrival date & time: 07/05/23  1940     History  Chief Complaint  Patient presents with   Tattoo infection    Carlos Horton is a 23 y.o. male.  Patient presents to the emergency department for concern over infected tattoo.  Tattoo was performed 4 days ago.  Patient reports the next day started noticing some redness and swelling.  There is no drainage.  No fever.       Home Medications Prior to Admission medications   Medication Sig Start Date End Date Taking? Authorizing Provider  doxycycline (VIBRAMYCIN) 100 MG capsule Take 1 capsule (100 mg total) by mouth 2 (two) times daily. 07/05/23  Yes Prem Coykendall, Canary Brim, MD  albuterol (VENTOLIN HFA) 108 (90 Base) MCG/ACT inhaler Inhale 2 puffs into the lungs every 4 (four) hours as needed for wheezing. 02/08/20   Bobbie Stack, MD  busPIRone (BUSPAR) 5 MG tablet Take 1 tablet (5 mg total) by mouth 2 (two) times daily. 03/30/22   Gilmore Laroche, FNP  DULoxetine (CYMBALTA) 30 MG capsule Take 1 capsule (30 mg total) by mouth daily. 03/30/22   Gilmore Laroche, FNP  gabapentin (NEURONTIN) 100 MG capsule Take 1 capsule (100 mg total) by mouth 3 (three) times daily. 07/07/21   Massengill, Harrold Donath, MD  lidocaine (XYLOCAINE) 2 % solution Gargle and spit 5 mL every 6 hours as needed for throat pain or discomfort. 04/13/23   Leath-Warren, Sadie Haber, NP  methocarbamol (ROBAXIN) 500 MG tablet Take 1 tablet (500 mg total) by mouth 2 (two) times daily. 02/28/23   Carmel Sacramento A, PA-C  naproxen (NAPROSYN) 500 MG tablet Take 1 tablet (500 mg total) by mouth 2 (two) times daily. 06/23/23   Carmel Sacramento A, PA-C  promethazine-dextromethorphan (PROMETHAZINE-DM) 6.25-15 MG/5ML syrup Take 5 mLs by mouth 4 (four) times daily as needed. 04/13/23   Leath-Warren, Sadie Haber, NP  traZODone (DESYREL) 50 MG tablet Take 1 tablet (50 mg total) by mouth at bedtime as needed for  sleep. 07/07/21   Massengill, Harrold Donath, MD  Vitamin D, Ergocalciferol, (DRISDOL) 1.25 MG (50000 UNIT) CAPS capsule Take 1 capsule (50,000 Units total) by mouth every 7 (seven) days. 03/30/22   Gilmore Laroche, FNP      Allergies    Patient has no known allergies.    Review of Systems   Review of Systems  Physical Exam Updated Vital Signs BP (!) 141/72   Pulse 100   Temp 98.5 F (36.9 C)   Resp 18   Ht 5\' 5"  (1.651 m)   Wt 77 kg   SpO2 99%   BMI 28.25 kg/m  Physical Exam Vitals and nursing note reviewed.  Constitutional:      General: He is not in acute distress.    Appearance: He is well-developed.  HENT:     Head: Normocephalic and atraumatic.     Mouth/Throat:     Mouth: Mucous membranes are moist.  Eyes:     General: Vision grossly intact. Gaze aligned appropriately.     Extraocular Movements: Extraocular movements intact.     Conjunctiva/sclera: Conjunctivae normal.  Cardiovascular:     Rate and Rhythm: Normal rate and regular rhythm.     Pulses: Normal pulses.     Heart sounds: Normal heart sounds, S1 normal and S2 normal. No murmur heard.    No friction rub. No gallop.  Pulmonary:     Effort: Pulmonary effort is normal.  No respiratory distress.     Breath sounds: Normal breath sounds.  Abdominal:     Palpations: Abdomen is soft.     Tenderness: There is no abdominal tenderness. There is no guarding or rebound.     Hernia: No hernia is present.  Musculoskeletal:        General: No swelling.     Cervical back: Full passive range of motion without pain, normal range of motion and neck supple. No pain with movement, spinous process tenderness or muscular tenderness. Normal range of motion.     Right lower leg: No edema.     Left lower leg: No edema.  Skin:    General: Skin is warm and dry.     Capillary Refill: Capillary refill takes less than 2 seconds.     Findings: Erythema (And warmth of the skin underlying tattoo volar aspect of left forearm) present. No  ecchymosis, lesion or wound.  Neurological:     Mental Status: He is alert and oriented to person, place, and time.     GCS: GCS eye subscore is 4. GCS verbal subscore is 5. GCS motor subscore is 6.     Cranial Nerves: Cranial nerves 2-12 are intact.     Sensory: Sensation is intact.     Motor: Motor function is intact. No weakness or abnormal muscle tone.     Coordination: Coordination is intact.  Psychiatric:        Mood and Affect: Mood normal.        Speech: Speech normal.        Behavior: Behavior normal.     ED Results / Procedures / Treatments   Labs (all labs ordered are listed, but only abnormal results are displayed) Labs Reviewed - No data to display  EKG None  Radiology No results found.  Procedures Procedures    Medications Ordered in ED Medications  doxycycline (VIBRA-TABS) tablet 100 mg (has no administration in time range)    ED Course/ Medical Decision Making/ A&P                                 Medical Decision Making Risk Prescription drug management.   There is erythema and warmth without induration, fluctuance of the left forearm.  Patient appears well, normal vital signs.  Treat for cellulitis, given return precautions.        Final Clinical Impression(s) / ED Diagnoses Final diagnoses:  Cellulitis of left upper extremity    Rx / DC Orders ED Discharge Orders          Ordered    doxycycline (VIBRAMYCIN) 100 MG capsule  2 times daily        07/05/23 2350              Gilda Crease, MD 07/05/23 2351

## 2023-07-07 ENCOUNTER — Encounter (HOSPITAL_COMMUNITY): Payer: Self-pay | Admitting: *Deleted

## 2023-07-07 ENCOUNTER — Other Ambulatory Visit: Payer: Self-pay

## 2023-07-07 ENCOUNTER — Emergency Department (HOSPITAL_COMMUNITY)
Admission: EM | Admit: 2023-07-07 | Discharge: 2023-07-07 | Disposition: A | Discharge status: Home / Self Care | Payer: Medicaid Other | Attending: Emergency Medicine | Admitting: Emergency Medicine

## 2023-07-07 DIAGNOSIS — U071 COVID-19: Secondary | ICD-10-CM | POA: Insufficient documentation

## 2023-07-07 DIAGNOSIS — J45909 Unspecified asthma, uncomplicated: Secondary | ICD-10-CM | POA: Insufficient documentation

## 2023-07-07 DIAGNOSIS — R112 Nausea with vomiting, unspecified: Secondary | ICD-10-CM | POA: Diagnosis present

## 2023-07-07 DIAGNOSIS — Z87442 Personal history of urinary calculi: Secondary | ICD-10-CM | POA: Insufficient documentation

## 2023-07-07 LAB — RESP PANEL BY RT-PCR (RSV, FLU A&B, COVID)  RVPGX2
Influenza A by PCR: NEGATIVE
Influenza B by PCR: NEGATIVE
Resp Syncytial Virus by PCR: NEGATIVE
SARS Coronavirus 2 by RT PCR: POSITIVE — AB

## 2023-07-07 MED ORDER — ONDANSETRON HCL 4 MG PO TABS: 4.0000 mg | ORAL_TABLET | Freq: Four times a day (QID) | ORAL | 0 refills | Status: AC

## 2023-07-07 MED ORDER — ACETAMINOPHEN 325 MG PO TABS
650.0000 mg | ORAL_TABLET | ORAL | Status: AC
  Administered 2023-07-07: 650 mg via ORAL
  Filled 2023-07-07: qty 2

## 2023-07-07 MED ORDER — KETOROLAC TROMETHAMINE 15 MG/ML IJ SOLN
15.0000 mg | Freq: Once | INTRAMUSCULAR | Status: AC
  Administered 2023-07-07: 15 mg via INTRAMUSCULAR
  Filled 2023-07-07: qty 1

## 2023-07-07 MED ORDER — ONDANSETRON HCL 4 MG/2ML IJ SOLN
4.0000 mg | Freq: Once | INTRAMUSCULAR | Status: AC
  Administered 2023-07-07: 4 mg via INTRAVENOUS
  Filled 2023-07-07: qty 2

## 2023-07-07 MED ORDER — SODIUM CHLORIDE 0.9 % IV BOLUS
500.0000 mL | Freq: Once | INTRAVENOUS | Status: AC
  Administered 2023-07-07: 500 mL via INTRAVENOUS

## 2023-07-07 NOTE — ED Provider Notes (Signed)
[place] EMERGENCY DEPARTMENT AT [facility] Provider Note   CSN: [id number] Arrival date & time: 07/07/23  1641     History  Chief Complaint  Patient presents with   Generalized Body Aches    Carlos Horton is a 23 y.o. male with medical history of kidney stones, asthma, insomnia, ADHD.  Patient presents to ED for evaluation of URI symptoms.  Patient reports his body aches, nausea and vomiting, sore throat beginning last night.  States his dad recently was diagnosed with COVID.  He denies abdominal pain, diarrhea, fevers.  Denies medications prior to arrival.  HPI     Home Medications Prior to Admission medications   Medication Sig Start Date End Date Taking? Authorizing Provider  ondansetron (ZOFRAN) 4 MG tablet Take 1 tablet (4 mg total) by mouth every 6 (six) hours. 07/07/23  Yes Al Decant, PA-C  albuterol (VENTOLIN HFA) 108 (90 Base) MCG/ACT inhaler Inhale 2 puffs into the lungs every 4 (four) hours as needed for wheezing. 02/08/20   Bobbie Stack, MD  busPIRone (BUSPAR) 5 MG tablet Take 1 tablet (5 mg total) by mouth 2 (two) times daily. 03/30/22   Gilmore Laroche, FNP  doxycycline (VIBRAMYCIN) 100 MG capsule Take 1 capsule (100 mg total) by mouth 2 (two) times daily. 07/05/23   Gilda Crease, MD  DULoxetine (CYMBALTA) 30 MG capsule Take 1 capsule (30 mg total) by mouth daily. 03/30/22   Gilmore Laroche, FNP  gabapentin (NEURONTIN) 100 MG capsule Take 1 capsule (100 mg total) by mouth 3 (three) times daily. 07/07/21   Massengill, Harrold Donath, MD  lidocaine (XYLOCAINE) 2 % solution Gargle and spit 5 mL every 6 hours as needed for throat pain or discomfort. 04/13/23   Leath-Warren, Sadie Haber, NP  methocarbamol (ROBAXIN) 500 MG tablet Take 1 tablet (500 mg total) by mouth 2 (two) times daily. 02/28/23   Carmel Sacramento A, PA-C  naproxen (NAPROSYN) 500 MG tablet Take 1 tablet (500 mg total) by mouth 2 (two) times daily. 06/23/23   Carmel Sacramento A, PA-C   promethazine-dextromethorphan (PROMETHAZINE-DM) 6.25-15 MG/5ML syrup Take 5 mLs by mouth 4 (four) times daily as needed. 04/13/23   Leath-Warren, Sadie Haber, NP  traZODone (DESYREL) 50 MG tablet Take 1 tablet (50 mg total) by mouth at bedtime as needed for sleep. 07/07/21   Massengill, Harrold Donath, MD  Vitamin D, Ergocalciferol, (DRISDOL) 1.25 MG (50000 UNIT) CAPS capsule Take 1 capsule (50,000 Units total) by mouth every 7 (seven) days. 03/30/22   Gilmore Laroche, FNP      Allergies    Patient has no known allergies.    Review of Systems   Review of Systems  Constitutional:  Positive for chills.  HENT:  Positive for sore throat.   Gastrointestinal:  Positive for nausea and vomiting.  Musculoskeletal:  Positive for myalgias.  All other systems reviewed and are negative.   Physical Exam Updated Vital Signs BP 121/85   Pulse (!) 112   Temp 98.9 F (37.2 C) (Oral)   Resp 18   Ht 5\' 5"  (1.651 m)   Wt 79.4 kg   SpO2 98%   BMI 29.12 kg/m  Physical Exam Vitals and nursing note reviewed.  Constitutional:      General: He is not in acute distress.    Appearance: He is well-developed.  HENT:     Head: Normocephalic and atraumatic.     Mouth/Throat:     Pharynx: Posterior oropharyngeal erythema present. No oropharyngeal exudate.  Comments: Uvula midline, handling secretions appropriately Eyes:     Conjunctiva/sclera: Conjunctivae normal.  Cardiovascular:     Rate and Rhythm: Normal rate and regular rhythm.     Heart sounds: No murmur heard. Pulmonary:     Effort: Pulmonary effort is normal. No respiratory distress.     Breath sounds: Normal breath sounds.  Abdominal:     Palpations: Abdomen is soft.     Tenderness: There is no abdominal tenderness.  Musculoskeletal:        General: No swelling.     Cervical back: Neck supple.  Skin:    General: Skin is warm and dry.     Capillary Refill: Capillary refill takes less than 2 seconds.  Neurological:     Mental Status: He is  alert and oriented to person, place, and time.  Psychiatric:        Mood and Affect: Mood normal.     ED Results / Procedures / Treatments   Labs (all labs ordered are listed, but only abnormal results are displayed) Labs Reviewed  RESP PANEL BY RT-PCR (RSV, FLU A&B, COVID)  [id number] - Abnormal; Notable for the following components:      Result Value   SARS Coronavirus 2 by RT PCR POSITIVE (*)    All other components within normal limits    EKG None  Radiology No results found.  Procedures Procedures   Medications Ordered in ED Medications  ketorolac (TORADOL) 15 MG/ML injection 15 mg (15 mg Intramuscular Given 07/07/23 1928)  acetaminophen (TYLENOL) tablet 650 mg (650 mg Oral Given 07/07/23 1928)  ondansetron (ZOFRAN) injection 4 mg (4 mg Intravenous Given 07/07/23 1945)  sodium chloride 0.9 % bolus 500 mL (500 mLs Intravenous New Bag/Given 07/07/23 1941)    ED Course/ Medical Decision Making/ A&P  Medical Decision Making Risk OTC drugs. Prescription drug management.   23 year old presents for evaluation.  Please see HPI for further details.  On examination patient is afebrile, tachycardic most likely as result of his viral syndrome.  Lung sounds are clear bilaterally, he is not hypoxic.  Abdomen is soft and compressible.  Neurological examinations at baseline.  Patient is COVID-19 positive.  He was provided with fluid, Zofran, Toradol and Tylenol.  Patient was advised how to treat symptoms conservatively at home.  Given return precautions.  Stable to discharge.   Final Clinical Impression(s) / ED Diagnoses Final diagnoses:  COVID-19    Rx / DC Orders ED Discharge Orders          Ordered    ondansetron (ZOFRAN) 4 MG tablet  Every 6 hours        07/07/23 2016              Clent Ridges 07/07/23 2016    Loetta Rough, MD 07/07/23 2137

## 2023-07-07 NOTE — ED Notes (Signed)
 Reviewed D/C information with the patient, pt verbalized understanding. No additional concerns at this time.

## 2023-07-07 NOTE — ED Triage Notes (Signed)
Pt with body aches and emesis + sore throat last night States his dad recently dx'd with Covid

## 2023-07-07 NOTE — Discharge Instructions (Addendum)
It was a pleasure taking part in your care.  As discussed, you have COVID-19.  Please take Tylenol at home for fevers and headache.  Please take ibuprofen for body aches and chills.  Please take Zofran every 6 hours as needed for nausea and vomiting.  Please attached guide concerning COVID-19.  Please see attached work note.  Please continue to push fluids at home such as Pedialyte, water.  Return to ED with any new symptoms.

## 2023-07-24 ENCOUNTER — Other Ambulatory Visit: Payer: Self-pay

## 2023-07-24 ENCOUNTER — Emergency Department (HOSPITAL_COMMUNITY)
Admission: EM | Admit: 2023-07-24 | Discharge: 2023-07-24 | Discharge status: Against Medical Advice | Attending: Emergency Medicine | Admitting: Emergency Medicine

## 2023-07-24 ENCOUNTER — Encounter (HOSPITAL_COMMUNITY): Payer: Self-pay

## 2023-07-24 DIAGNOSIS — L03114 Cellulitis of left upper limb: Secondary | ICD-10-CM | POA: Diagnosis not present

## 2023-07-24 DIAGNOSIS — Z5321 Procedure and treatment not carried out due to patient leaving prior to being seen by health care provider: Secondary | ICD-10-CM | POA: Diagnosis not present

## 2023-07-24 NOTE — ED Notes (Addendum)
 Pt and family walked out of the ED and stated he will go to urgent care in the morning.

## 2023-07-24 NOTE — ED Triage Notes (Signed)
 Pt got a tatto on left forearm x 4 days ago and has noticed redness and warmth of skin surrounding tattoo.

## 2023-07-25 ENCOUNTER — Emergency Department (HOSPITAL_COMMUNITY)
Admission: EM | Admit: 2023-07-25 | Discharge: 2023-07-25 | Disposition: A | Discharge status: Home / Self Care | Attending: Emergency Medicine | Admitting: Emergency Medicine

## 2023-07-25 ENCOUNTER — Encounter (HOSPITAL_COMMUNITY): Payer: Self-pay

## 2023-07-25 DIAGNOSIS — M79632 Pain in left forearm: Secondary | ICD-10-CM | POA: Diagnosis present

## 2023-07-25 DIAGNOSIS — J45909 Unspecified asthma, uncomplicated: Secondary | ICD-10-CM | POA: Insufficient documentation

## 2023-07-25 DIAGNOSIS — L03114 Cellulitis of left upper limb: Secondary | ICD-10-CM

## 2023-07-25 DIAGNOSIS — Z87891 Personal history of nicotine dependence: Secondary | ICD-10-CM | POA: Diagnosis not present

## 2023-07-25 MED ORDER — CEPHALEXIN 500 MG PO CAPS
500.0000 mg | ORAL_CAPSULE | Freq: Once | ORAL | Status: AC
  Administered 2023-07-25: 500 mg via ORAL
  Filled 2023-07-25: qty 1

## 2023-07-25 MED ORDER — CEPHALEXIN 500 MG PO CAPS: 500.0000 mg | ORAL_CAPSULE | Freq: Four times a day (QID) | ORAL | 0 refills | Status: AC
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
## 2023-07-25 NOTE — ED Triage Notes (Signed)
 Pov from home. Cc of infection at tattoo site. Was seen a couple weeks ago and was treated for cellulitis on left forearm. York Spaniel it was treated. But then a couple days ago, got a new tattoo right beside same area and now it is infected too.  Educated on tattoo care.

## 2023-07-25 NOTE — Discharge Instructions (Signed)
 You were evaluated in the Emergency Department and after careful evaluation, we did not find any emergent condition requiring admission or further testing in the hospital.  Your exam/testing today is overall reassuring.  Symptoms may be due to a cellulitis or skin infection.  Take the Keflex antibiotic as directed.  Tylenol or Motrin for discomfort.  Please return to the Emergency Department if you experience any worsening of your condition.   Thank you for allowing [place] to be a part of your care.

## 2023-09-14 ENCOUNTER — Other Ambulatory Visit: Payer: Self-pay

## 2023-09-14 ENCOUNTER — Emergency Department (HOSPITAL_BASED_OUTPATIENT_CLINIC_OR_DEPARTMENT_OTHER)

## 2023-09-14 ENCOUNTER — Encounter (HOSPITAL_BASED_OUTPATIENT_CLINIC_OR_DEPARTMENT_OTHER): Payer: Self-pay

## 2023-09-14 ENCOUNTER — Emergency Department (HOSPITAL_BASED_OUTPATIENT_CLINIC_OR_DEPARTMENT_OTHER)
Admission: EM | Admit: 2023-09-14 | Discharge: 2023-09-14 | Disposition: A | Discharge status: Home / Self Care | Attending: Emergency Medicine | Admitting: Emergency Medicine

## 2023-09-14 DIAGNOSIS — X58XXXA Exposure to other specified factors, initial encounter: Secondary | ICD-10-CM | POA: Insufficient documentation

## 2023-09-14 DIAGNOSIS — Y9367 Activity, basketball: Secondary | ICD-10-CM | POA: Insufficient documentation

## 2023-09-14 DIAGNOSIS — M25562 Pain in left knee: Secondary | ICD-10-CM

## 2023-09-14 DIAGNOSIS — S8992XA Unspecified injury of left lower leg, initial encounter: Secondary | ICD-10-CM | POA: Diagnosis not present

## 2023-09-14 DIAGNOSIS — M25462 Effusion, left knee: Secondary | ICD-10-CM | POA: Diagnosis not present

## 2023-09-14 MED ORDER — NAPROXEN 500 MG PO TABS: 500.0000 mg | ORAL_TABLET | Freq: Two times a day (BID) | ORAL | 0 refills | Status: AC

## 2023-09-14 MED ORDER — NAPROXEN 250 MG PO TABS
500.0000 mg | ORAL_TABLET | Freq: Once | ORAL | Status: AC
  Administered 2023-09-14: 500 mg via ORAL
  Filled 2023-09-14: qty 2

## 2023-09-14 NOTE — ED Provider Notes (Signed)
 [place] EMERGENCY DEPARTMENT AT [facility]  Provider Note  CSN: [id number] Arrival date & time: 09/14/23 0341  History Chief Complaint  Patient presents with   Knee Pain    Carlos Horton is a 23 y.o. male reports 2-3 days of pain in L knee after playing basketball and swimming. No specific injury. He has had problems with that knee before. Complaining of some swelling and feeling like the knee is giving out when he's walking. He was told in the distant past he might knee surgery on that knee at some point.    Home Medications Prior to Admission medications   Medication Sig Start Date End Date Taking? Authorizing Provider  albuterol  (VENTOLIN  HFA) 108 (90 Base) MCG/ACT inhaler Inhale 2 puffs into the lungs every 4 (four) hours as needed for wheezing. 02/08/20   Randye Buttner, MD  busPIRone  (BUSPAR ) 5 MG tablet Take 1 tablet (5 mg total) by mouth 2 (two) times daily. 03/30/22   Zarwolo, Gloria, FNP  DULoxetine  (CYMBALTA ) 30 MG capsule Take 1 capsule (30 mg total) by mouth daily. 03/30/22   Zarwolo, Gloria, FNP  gabapentin  (NEURONTIN ) 100 MG capsule Take 1 capsule (100 mg total) by mouth 3 (three) times daily. 07/07/21   Massengill, Elana Grayer, MD  lidocaine  (XYLOCAINE ) 2 % solution Gargle and spit 5 mL every 6 hours as needed for throat pain or discomfort. 04/13/23   Leath-Warren, Belen Bowers, NP  methocarbamol  (ROBAXIN ) 500 MG tablet Take 1 tablet (500 mg total) by mouth 2 (two) times daily. 02/28/23   Beatty, Celeste A, PA-C  naproxen  (NAPROSYN ) 500 MG tablet Take 1 tablet (500 mg total) by mouth 2 (two) times daily. 09/14/23   Charmayne Cooper, MD  ondansetron  (ZOFRAN ) 4 MG tablet Take 1 tablet (4 mg total) by mouth every 6 (six) hours. 07/07/23   Adel Aden, PA-C  promethazine -dextromethorphan (PROMETHAZINE -DM) 6.25-15 MG/5ML syrup Take 5 mLs by mouth 4 (four) times daily as needed. 04/13/23   Leath-Warren, Belen Bowers, NP  traZODone  (DESYREL ) 50 MG tablet Take 1 tablet (50  mg total) by mouth at bedtime as needed for sleep. 07/07/21   Massengill, Elana Grayer, MD  Vitamin D , Ergocalciferol , (DRISDOL ) 1.25 MG (50000 UNIT) CAPS capsule Take 1 capsule (50,000 Units total) by mouth every 7 (seven) days. 03/30/22   Zarwolo, Gloria, FNP     Allergies    Patient has no known allergies.   Review of Systems   Review of Systems Please see HPI for pertinent positives and negatives  Physical Exam BP 135/79   Pulse 96   Temp 98.4 F (36.9 C) (Oral)   Resp 15   Ht 5\' 5"  (1.651 m)   Wt 77.1 kg   SpO2 100%   BMI 28.29 kg/m   Physical Exam Vitals and nursing note reviewed.  HENT:     Head: Normocephalic.     Nose: Nose normal.  Eyes:     Extraocular Movements: Extraocular movements intact.  Pulmonary:     Effort: Pulmonary effort is normal.  Musculoskeletal:        General: Tenderness (L knee diffusely, no focal bony tenderness) present. Normal range of motion.     Cervical back: Neck supple.     Comments: Mild effusion L knee, no instability  Skin:    Findings: No rash (on exposed skin).  Neurological:     Mental Status: He is alert and oriented to person, place, and time.  Psychiatric:        Mood  and Affect: Mood normal.     ED Results / Procedures / Treatments   EKG None  Procedures Procedures  Medications Ordered in the ED Medications  naproxen  (NAPROSYN ) tablet 500 mg (has no administration in time range)    Initial Impression and Plan  Patient here for L knee pain, I personally viewed the images from radiology studies and agree with radiologist interpretation: Xrays are neg for fracture. Plan knee brace, rest, ice and Ortho follow up. RTED for any other concerns.  ED Course       MDM Rules/Calculators/A&P Medical Decision Making Problems Addressed: Acute pain of left knee: acute illness or injury  Amount and/or Complexity of Data Reviewed Radiology: ordered and independent interpretation performed. Decision-making details  documented in ED Course.  Risk Prescription drug management.     Final Clinical Impression(s) / ED Diagnoses Final diagnoses:  Acute pain of left knee    Rx / DC Orders ED Discharge Orders          Ordered    naproxen  (NAPROSYN ) 500 MG tablet  2 times daily        09/14/23 0517             Charmayne Cooper, MD 09/14/23 [phone]

## 2023-09-14 NOTE — ED Triage Notes (Signed)
 Patient reports hurting his knee a few days ago. States that day he was playing basketball and swimming. Since then, the knee pain has gotten worse. Patient states the knee "gives out" while at work and he hears a popping sound in the knee. Left knee pain rated 8/10 when waking. Patient took Advil  at home for pain with no relief. Patient has also been icing the knee and keeping it elevated at home.

## 2023-09-18 ENCOUNTER — Encounter (HOSPITAL_COMMUNITY): Payer: Self-pay

## 2023-09-18 ENCOUNTER — Other Ambulatory Visit: Payer: Self-pay

## 2023-09-18 ENCOUNTER — Emergency Department (HOSPITAL_COMMUNITY)
Admission: EM | Admit: 2023-09-18 | Discharge: 2023-09-19 | Disposition: A | Discharge status: Home / Self Care | Attending: Emergency Medicine | Admitting: Emergency Medicine

## 2023-09-18 DIAGNOSIS — Z87891 Personal history of nicotine dependence: Secondary | ICD-10-CM | POA: Insufficient documentation

## 2023-09-18 DIAGNOSIS — M25562 Pain in left knee: Secondary | ICD-10-CM | POA: Insufficient documentation

## 2023-09-18 DIAGNOSIS — J45909 Unspecified asthma, uncomplicated: Secondary | ICD-10-CM | POA: Diagnosis not present

## 2023-09-18 NOTE — ED Triage Notes (Signed)
 Pt reports knee pain after playing basketball on 4/26. Pt was seen at ER and had xray done with no findings. Pt reports he is supposed to see orthopedic specialist next Friday but reports pain has worsened since the injury. Pt in brace and taking naproxen  without relief.

## 2023-09-19 MED ORDER — HYDROCODONE-ACETAMINOPHEN 5-325 MG PO TABS
1.0000 | ORAL_TABLET | Freq: Once | ORAL | Status: AC
  Administered 2023-09-19: 1 via ORAL
  Filled 2023-09-19: qty 1

## 2023-09-19 MED ORDER — HYDROCODONE-ACETAMINOPHEN 5-325 MG PO TABS: 1.0000 | ORAL_TABLET | ORAL | 0 refills | Status: AC | PRN
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
## 2023-09-19 NOTE — Discharge Instructions (Signed)
 You were evaluated in the Emergency Department and after careful evaluation, we did not find any emergent condition requiring admission or further testing in the hospital.  Your exam/testing today was overall reassuring.  Pain may be related to an injury to your ligament in your knee.  Continue the Naprosyn  twice daily.  Can use the Norco medication for significant pain.  Keep your follow-up with the orthopedic specialist.  Please return to the Emergency Department if you experience any worsening of your condition.  Thank you for allowing us  to be a part of your care.

## 2023-09-19 NOTE — ED Notes (Signed)
 ED Provider at bedside.

## 2023-09-27 ENCOUNTER — Encounter: Payer: Self-pay | Admitting: Orthopedic Surgery

## 2023-09-27 ENCOUNTER — Ambulatory Visit (INDEPENDENT_AMBULATORY_CARE_PROVIDER_SITE_OTHER): Admitting: Orthopedic Surgery

## 2023-09-27 VITALS — BP 149/93 | HR 63 | Wt 179.0 lb

## 2023-09-27 DIAGNOSIS — M25562 Pain in left knee: Secondary | ICD-10-CM | POA: Diagnosis not present

## 2023-09-27 MED ORDER — SULINDAC 150 MG PO TABS
150.0000 mg | ORAL_TABLET | Freq: Two times a day (BID) | ORAL | 1 refills | Status: AC
Start: 2023-09-27 — End: ?
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
## 2023-09-27 NOTE — Progress Notes (Signed)
  Intake history:  There were no vitals taken for this visit. There is no height or weight on file to calculate BMI.    WHAT ARE WE SEEING YOU FOR TODAY?   Left knee  How long has this bothered you? (DOI?DOS?WS?)  3 week(s) ago  Anticoag.  No  Diabetes No  Heart disease No  Hypertension No  SMOKING HX Yes  Kidney disease No  Any ALLERGIES no alleries______________________________________________   Treatment:  Have you taken:  Tylenol  No  Advil  No  Had PT No  Had injection No  Other  __hydrocodone given by ED_______________________

## 2023-11-01 ENCOUNTER — Ambulatory Visit: Admitting: Orthopedic Surgery

## 2023-11-01 ENCOUNTER — Encounter: Payer: Self-pay | Admitting: Orthopedic Surgery

## 2023-11-01 DIAGNOSIS — M2242 Chondromalacia patellae, left knee: Secondary | ICD-10-CM

## 2023-11-01 NOTE — Patient Instructions (Signed)
 Physical therapy has been ordered for you at [facility]. They should call you to schedule, [phone] is the phone number to call, if you want to call to schedule.

## 2023-11-01 NOTE — Progress Notes (Signed)
   Chief Complaint  Patient presents with   Knee Pain    Left     Encounter Diagnosis  Name Primary?   Chondromalacia, patella, left Yes    DOI/DOS/ Date: since April 2025  23 year old male long history of knee pain Last seen started on NSAIDs Clinoril  and given a brace  Still complains of retropatellar pain  Clinical exam shows stable ligaments mild pain with valgus stress at 30 degrees but no instability  Tenderness under the medial facet chondromalacia symptoms with positive grind test positive patella compression test left patella no effusion  MRI suggested but patient did not get his physical therapy because we did not put the order in  So we will order the physical therapy have him come back in 30 days and reassess the need for MRI

## 2023-11-01 NOTE — Progress Notes (Signed)
   There were no vitals taken for this visit.  There is no height or weight on file to calculate BMI.  Chief Complaint  Patient presents with   Knee Pain    Left     No diagnosis found.  DOI/DOS/ Date: since April 2025  Unchanged

## 2023-11-02 DIAGNOSIS — L0390 Cellulitis, unspecified: Secondary | ICD-10-CM | POA: Diagnosis not present

## 2023-11-02 DIAGNOSIS — Z6829 Body mass index (BMI) 29.0-29.9, adult: Secondary | ICD-10-CM | POA: Diagnosis not present

## 2023-11-02 DIAGNOSIS — E663 Overweight: Secondary | ICD-10-CM | POA: Diagnosis not present

## 2023-11-02 DIAGNOSIS — R030 Elevated blood-pressure reading, without diagnosis of hypertension: Secondary | ICD-10-CM | POA: Diagnosis not present
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
## 2023-11-07 ENCOUNTER — Telehealth (HOSPITAL_COMMUNITY): Payer: Self-pay

## 2023-11-07 ENCOUNTER — Ambulatory Visit (HOSPITAL_COMMUNITY)

## 2023-11-07 NOTE — Telephone Encounter (Signed)
 Called and left message with patient regarding missed appointment for physical therapy evaluation; requested call back to reschedule.   8:24 AM, 11/07/23 [place] Ponder [place] Kinzlie Harney MPT [facility] [place] [phone]
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
## 2023-12-15 ENCOUNTER — Other Ambulatory Visit: Payer: Self-pay

## 2023-12-15 ENCOUNTER — Emergency Department (HOSPITAL_COMMUNITY): Admission: EM | Admit: 2023-12-15 | Discharge: 2023-12-15 | Disposition: A | Discharge status: Home / Self Care

## 2023-12-15 ENCOUNTER — Emergency Department (HOSPITAL_COMMUNITY)

## 2023-12-15 ENCOUNTER — Encounter (HOSPITAL_COMMUNITY): Payer: Self-pay | Admitting: Emergency Medicine

## 2023-12-15 DIAGNOSIS — S92354A Nondisplaced fracture of fifth metatarsal bone, right foot, initial encounter for closed fracture: Secondary | ICD-10-CM | POA: Insufficient documentation

## 2023-12-15 DIAGNOSIS — Z87442 Personal history of urinary calculi: Secondary | ICD-10-CM | POA: Diagnosis not present

## 2023-12-15 DIAGNOSIS — J45909 Unspecified asthma, uncomplicated: Secondary | ICD-10-CM | POA: Insufficient documentation

## 2023-12-15 DIAGNOSIS — S99921A Unspecified injury of right foot, initial encounter: Secondary | ICD-10-CM | POA: Diagnosis present

## 2023-12-15 DIAGNOSIS — S92351A Displaced fracture of fifth metatarsal bone, right foot, initial encounter for closed fracture: Secondary | ICD-10-CM | POA: Diagnosis not present

## 2023-12-15 MED ORDER — KETOROLAC TROMETHAMINE 15 MG/ML IJ SOLN
15.0000 mg | Freq: Once | INTRAMUSCULAR | Status: AC
  Administered 2023-12-15: 15 mg via INTRAMUSCULAR
  Filled 2023-12-15: qty 1
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
## 2023-12-15 NOTE — Discharge Instructions (Addendum)
 You were evaluated in the emergency room for foot pain.  You are found to have a fracture of your fifth metatarsal.  You may use ice, Tylenol  and ibuprofen  for pain.  Please avoid weightbearing.

## 2023-12-15 NOTE — ED Notes (Signed)
 ED Provider at bedside.

## 2023-12-17 ENCOUNTER — Ambulatory Visit (HOSPITAL_COMMUNITY): Attending: Orthopedic Surgery

## 2023-12-17 DIAGNOSIS — R262 Difficulty in walking, not elsewhere classified: Secondary | ICD-10-CM | POA: Insufficient documentation

## 2023-12-17 DIAGNOSIS — M2242 Chondromalacia patellae, left knee: Secondary | ICD-10-CM | POA: Insufficient documentation

## 2023-12-17 DIAGNOSIS — M25562 Pain in left knee: Secondary | ICD-10-CM | POA: Insufficient documentation

## 2023-12-17 NOTE — Therapy (Signed)
 [facility] Outpatient Rehabilitation at [facility] [address] [place], [place], [address] Phone: [phone]   Fax:  [phone]  Patient Details  Name: Carlos Horton MRN: [id number] Date of Birth: 03/08/2001 Referring Provider:  Margrette Taft BRAVO, MD  Patient arrives for appointment to evaluate his knee pain  but unfortunately he fell yesterday and broke his toe on his right foot and is now using crutches and wearing an orthopedic shoe.  We do not have a referral for his toe and his knee is currently not bothering him.  He will return after toe is healed to assess knee when appropriate.  Instructed him to follow up with Dr. Areatha office.  2:43 PM, 12/17/23 Carlos Horton Carlos Horton MPT [facility] [place] [phone]   [facility] Outpatient Rehabilitation at [facility] [address] [place], [place], [address] Phone: [phone]   Fax:  [phone]

## 2023-12-18 ENCOUNTER — Encounter: Admitting: Orthopedic Surgery

## 2024-01-02 ENCOUNTER — Ambulatory Visit: Payer: Medicaid Other | Admitting: Urology

## 2024-01-22 ENCOUNTER — Telehealth: Payer: Self-pay

## 2024-01-22 DIAGNOSIS — Z7189 Other specified counseling: Secondary | ICD-10-CM

## 2024-01-22 NOTE — Progress Notes (Signed)
 Complex Care Management Note Care Guide Note  01/22/2024 Name: ZEEV DEAKINS MRN: [id number] DOB: [birth year]   Complex Care Management Outreach Attempts: An unsuccessful telephone outreach was attempted today to offer the patient information about available complex care management services.  Follow Up Plan:  Additional outreach attempts will be made to offer the patient complex care management information and services.   Encounter Outcome:  No Answer  Jeoffrey Buffalo , RMA     [place]  [facility], [facility] Guide  Direct Dial: [phone]  Website: [email or web address]

## 2024-01-28 NOTE — Progress Notes (Unsigned)
 Complex Care Management Note Care Guide Note  01/28/2024 Name: Carlos Horton MRN: [id number] DOB: 04-26-2001   Complex Care Management Outreach Attempts: A second unsuccessful outreach was attempted today to offer the patient with information about available complex care management services.  Follow Up Plan:  Additional outreach attempts will be made to offer the patient complex care management information and services.   Encounter Outcome:  No Answer  Jeoffrey Buffalo , RMA     [place]  [facility] [place], [facility] Guide  Direct Dial: [phone]  Website: [email or web address]

## 2024-01-29 NOTE — Progress Notes (Signed)
 Complex Care Management Note Care Guide Note  01/29/2024 Name: Carlos Horton MRN: [id number] DOB: 04-23-2001   Complex Care Management Outreach Attempts: A third unsuccessful outreach was attempted today to offer the patient with information about available complex care management services.  Follow Up Plan:  No further outreach attempts will be made at this time. We have been unable to contact the patient to offer or enroll patient in complex care management services.  Encounter Outcome:  No Answer  Jeoffrey Buffalo , RMA     [place]  [facility], [facility] Guide  Direct Dial: [phone]  Website: [email or web address]

## 2024-03-09 ENCOUNTER — Ambulatory Visit: Admitting: Urology

## 2024-04-01 DIAGNOSIS — R030 Elevated blood-pressure reading, without diagnosis of hypertension: Secondary | ICD-10-CM | POA: Diagnosis not present

## 2024-04-01 DIAGNOSIS — E669 Obesity, unspecified: Secondary | ICD-10-CM | POA: Diagnosis not present

## 2024-04-01 DIAGNOSIS — Z6833 Body mass index (BMI) 33.0-33.9, adult: Secondary | ICD-10-CM | POA: Diagnosis not present

## 2024-04-01 DIAGNOSIS — J069 Acute upper respiratory infection, unspecified: Secondary | ICD-10-CM | POA: Diagnosis not present

## 2024-04-01 DIAGNOSIS — R059 Cough, unspecified: Secondary | ICD-10-CM | POA: Diagnosis not present

## 2024-04-01 DIAGNOSIS — J4520 Mild intermittent asthma, uncomplicated: Secondary | ICD-10-CM | POA: Diagnosis not present
